# Patient Record
Sex: Female | Born: 1935 | Race: White | Hispanic: No | Smoking: Never smoker
Health system: Southern US, Community
[De-identification: ages and names within clinical notes are randomized; demographics above are authoritative.]

## PROBLEM LIST (undated history)

## (undated) ENCOUNTER — Emergency Department (HOSPITAL_BASED_OUTPATIENT_CLINIC_OR_DEPARTMENT_OTHER): Admission: EM | Payer: Medicare Other

## (undated) DIAGNOSIS — E538 Deficiency of other specified B group vitamins: Secondary | ICD-10-CM

## (undated) DIAGNOSIS — I10 Essential (primary) hypertension: Secondary | ICD-10-CM

## (undated) DIAGNOSIS — E7800 Pure hypercholesterolemia, unspecified: Secondary | ICD-10-CM

## (undated) DIAGNOSIS — H3560 Retinal hemorrhage, unspecified eye: Secondary | ICD-10-CM

## (undated) DIAGNOSIS — L508 Other urticaria: Secondary | ICD-10-CM

## (undated) DIAGNOSIS — M810 Age-related osteoporosis without current pathological fracture: Secondary | ICD-10-CM

## (undated) DIAGNOSIS — N183 Chronic kidney disease, stage 3 unspecified: Secondary | ICD-10-CM

## (undated) DIAGNOSIS — H3530 Unspecified macular degeneration: Secondary | ICD-10-CM

## (undated) DIAGNOSIS — E119 Type 2 diabetes mellitus without complications: Secondary | ICD-10-CM

## (undated) HISTORY — DX: Pure hypercholesterolemia, unspecified: E78.00

## (undated) HISTORY — DX: Other urticaria: L50.8

## (undated) HISTORY — PX: BUNIONECTOMY: SHX129

## (undated) HISTORY — DX: Chronic kidney disease, stage 3 unspecified: N18.30

## (undated) HISTORY — DX: Unspecified macular degeneration: H35.30

## (undated) HISTORY — DX: Retinal hemorrhage, unspecified eye: H35.60

## (undated) HISTORY — DX: Deficiency of other specified B group vitamins: E53.8

## (undated) HISTORY — DX: Age-related osteoporosis without current pathological fracture: M81.0

## (undated) HISTORY — PX: TONSILLECTOMY: SUR1361

## (undated) HISTORY — DX: Essential (primary) hypertension: I10

---

## 1971-07-04 HISTORY — PX: CHOLECYSTECTOMY: SHX55

## 1978-07-03 HISTORY — PX: DILATION AND CURETTAGE, DIAGNOSTIC / THERAPEUTIC: SUR384

## 1998-09-16 ENCOUNTER — Encounter: Payer: Self-pay | Admitting: Family Medicine

## 1998-09-16 ENCOUNTER — Encounter: Admission: RE | Admit: 1998-09-16 | Discharge: 1998-09-16 | Payer: Self-pay | Admitting: *Deleted

## 2000-03-21 ENCOUNTER — Other Ambulatory Visit: Admission: RE | Admit: 2000-03-21 | Discharge: 2000-03-21 | Payer: Self-pay | Admitting: Family Medicine

## 2002-03-12 ENCOUNTER — Other Ambulatory Visit: Admission: RE | Admit: 2002-03-12 | Discharge: 2002-03-12 | Payer: Self-pay | Admitting: Family Medicine

## 2005-03-08 ENCOUNTER — Other Ambulatory Visit: Admission: RE | Admit: 2005-03-08 | Discharge: 2005-03-08 | Payer: Self-pay | Admitting: Family Medicine

## 2008-11-19 ENCOUNTER — Other Ambulatory Visit: Admission: RE | Admit: 2008-11-19 | Discharge: 2008-11-19 | Payer: Self-pay | Admitting: Family Medicine

## 2011-12-19 ENCOUNTER — Ambulatory Visit
Admission: RE | Admit: 2011-12-19 | Discharge: 2011-12-19 | Disposition: A | Discharge status: Home / Self Care | Payer: Medicare Other | Source: Ambulatory Visit | Attending: Family Medicine | Admitting: Family Medicine

## 2011-12-19 ENCOUNTER — Other Ambulatory Visit: Payer: Self-pay | Admitting: Family Medicine

## 2011-12-19 DIAGNOSIS — S60219A Contusion of unspecified wrist, initial encounter: Secondary | ICD-10-CM

## 2011-12-19 IMAGING — CR DG WRIST COMPLETE 3+V*R*
4 series · 4 of 4 positions shown · non-contrast
Comparison: None.

CLINICAL DATA: Fell 2 days ago with pain and swelling

RIGHT WRIST - COMPLETE 3+ VIEW

[x wrist pa right]
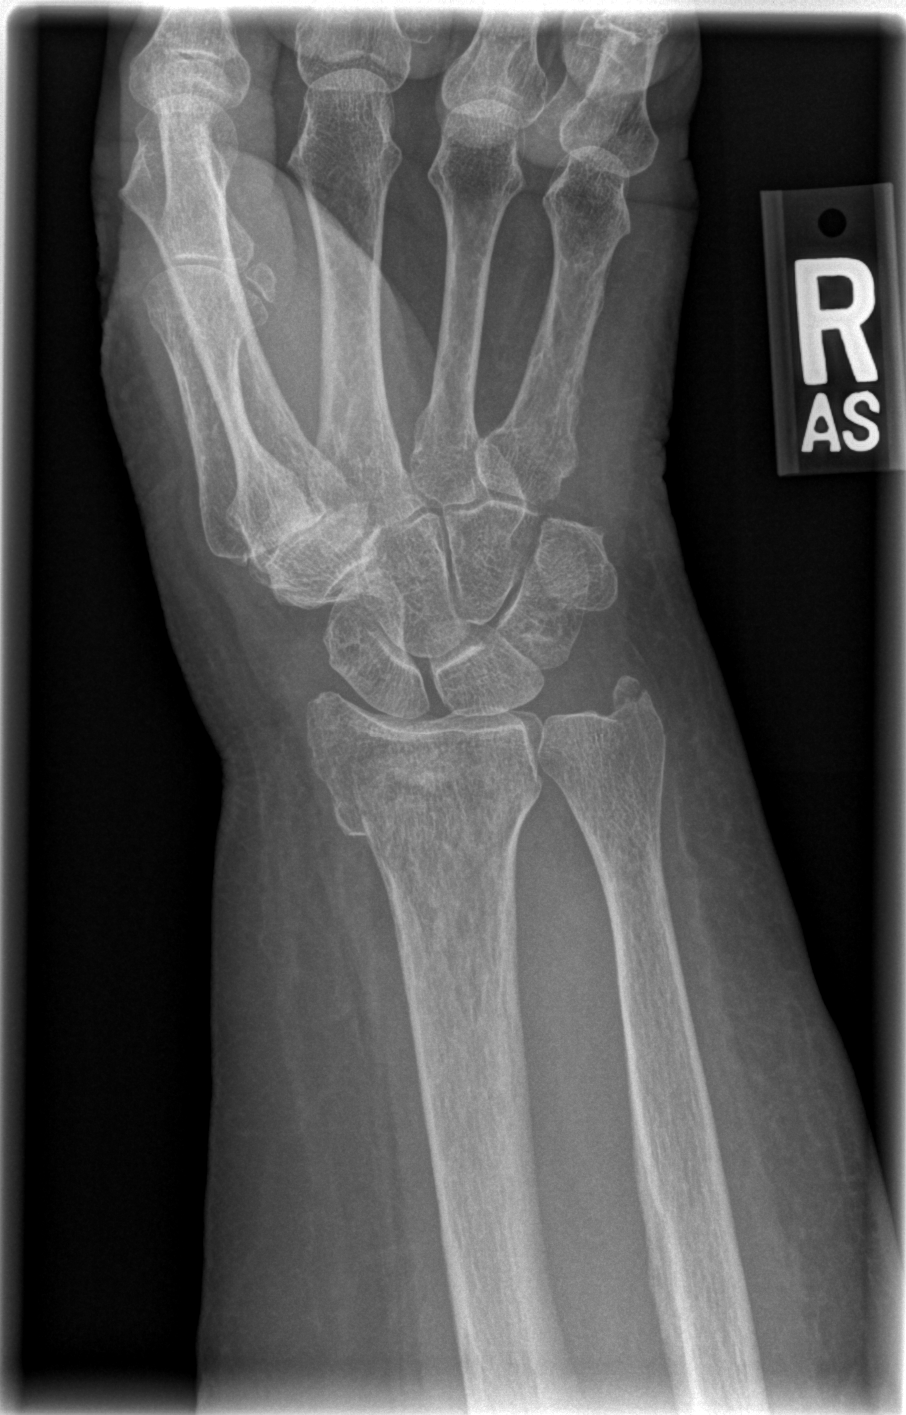

[x wrist obl right]
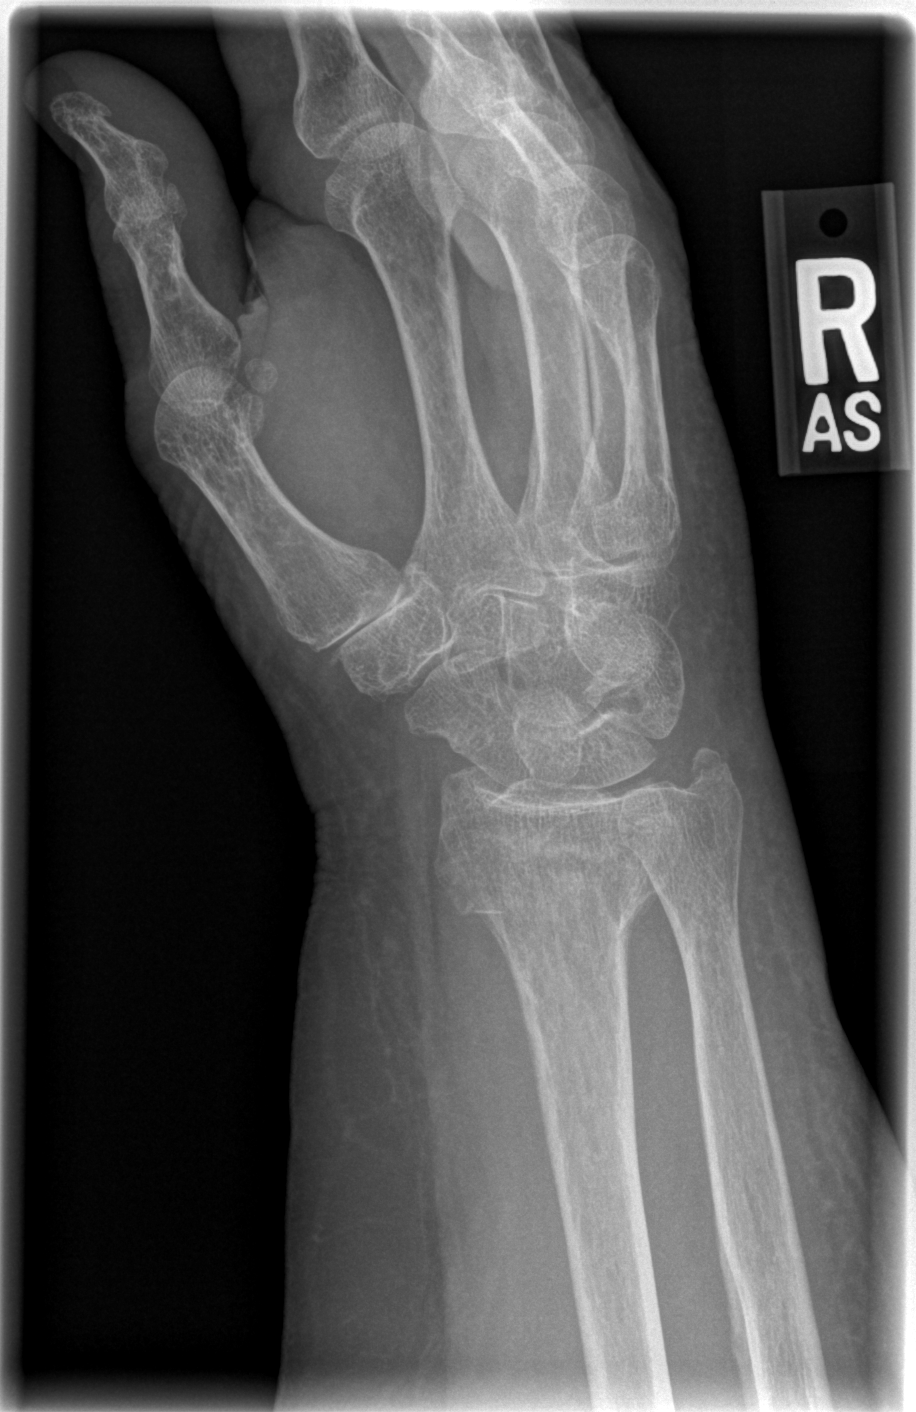

[x wrist lat right]
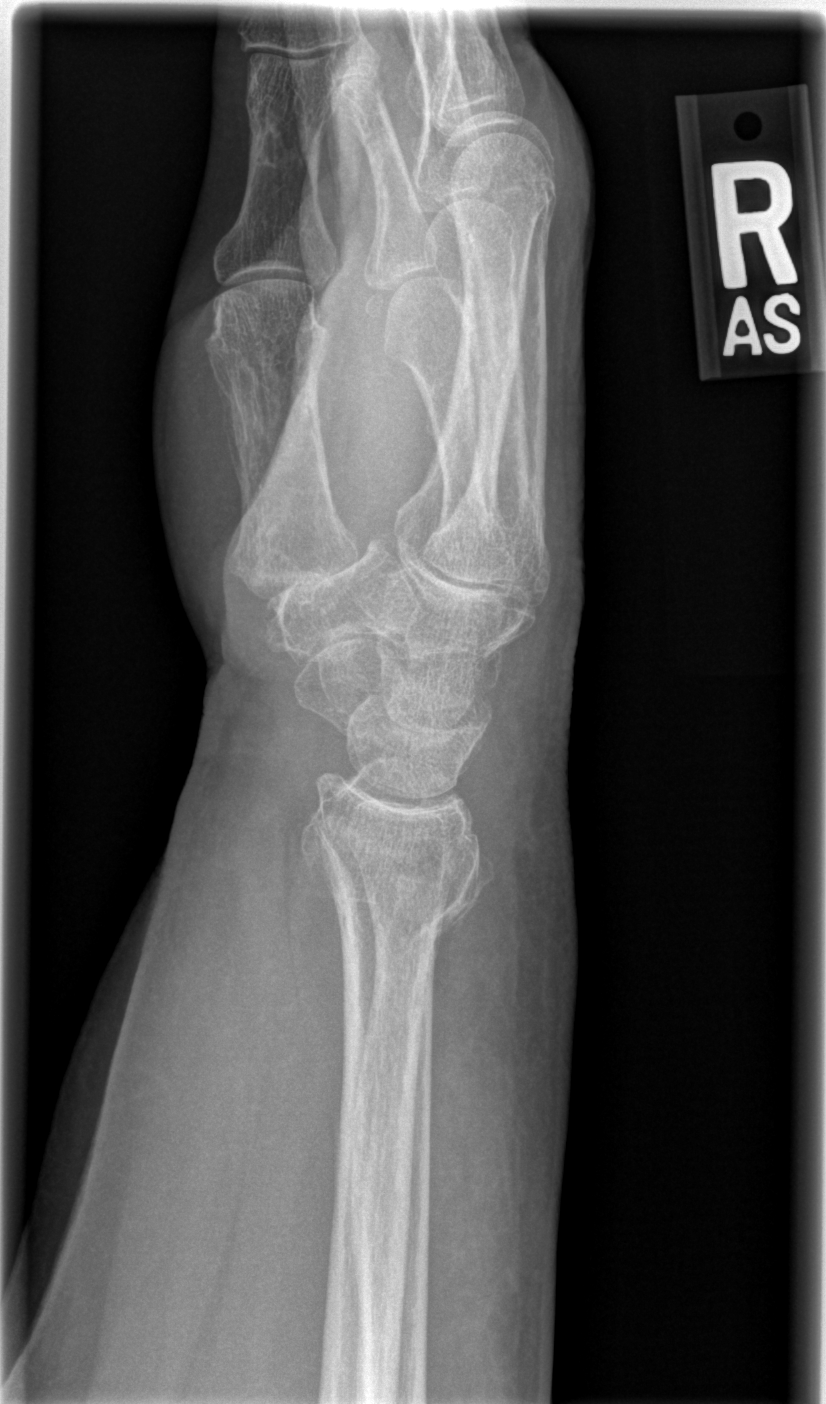

[x navicular]
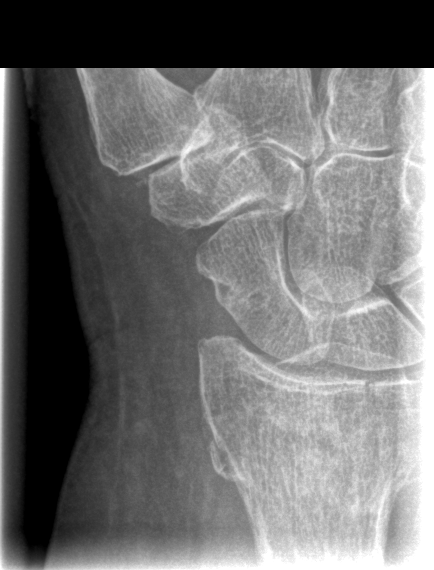

[4 of 4 positions shown; findings below may reference images not displayed]

FINDINGS: There is a slightly impacted comminuted intra-articular
fracture of the distal right radius.  A nondisplaced fracture of
the ulnar styloid is also noted.  The carpal bones are in normal
position.  No other abnormality is seen.
IMPRESSION: Impacted intra-articular fracture of the distal right radius with
fracture of the ulnar styloid.  Osteopenia.

## 2011-12-19 IMAGING — CR DG FOREARM 2V*R*
2 series · 2 of 2 positions shown · non-contrast
Comparison: None.

CLINICAL DATA: Fell 2 days ago with pain and swelling

RIGHT FOREARM - 2 VIEW

[x forearm ap right]
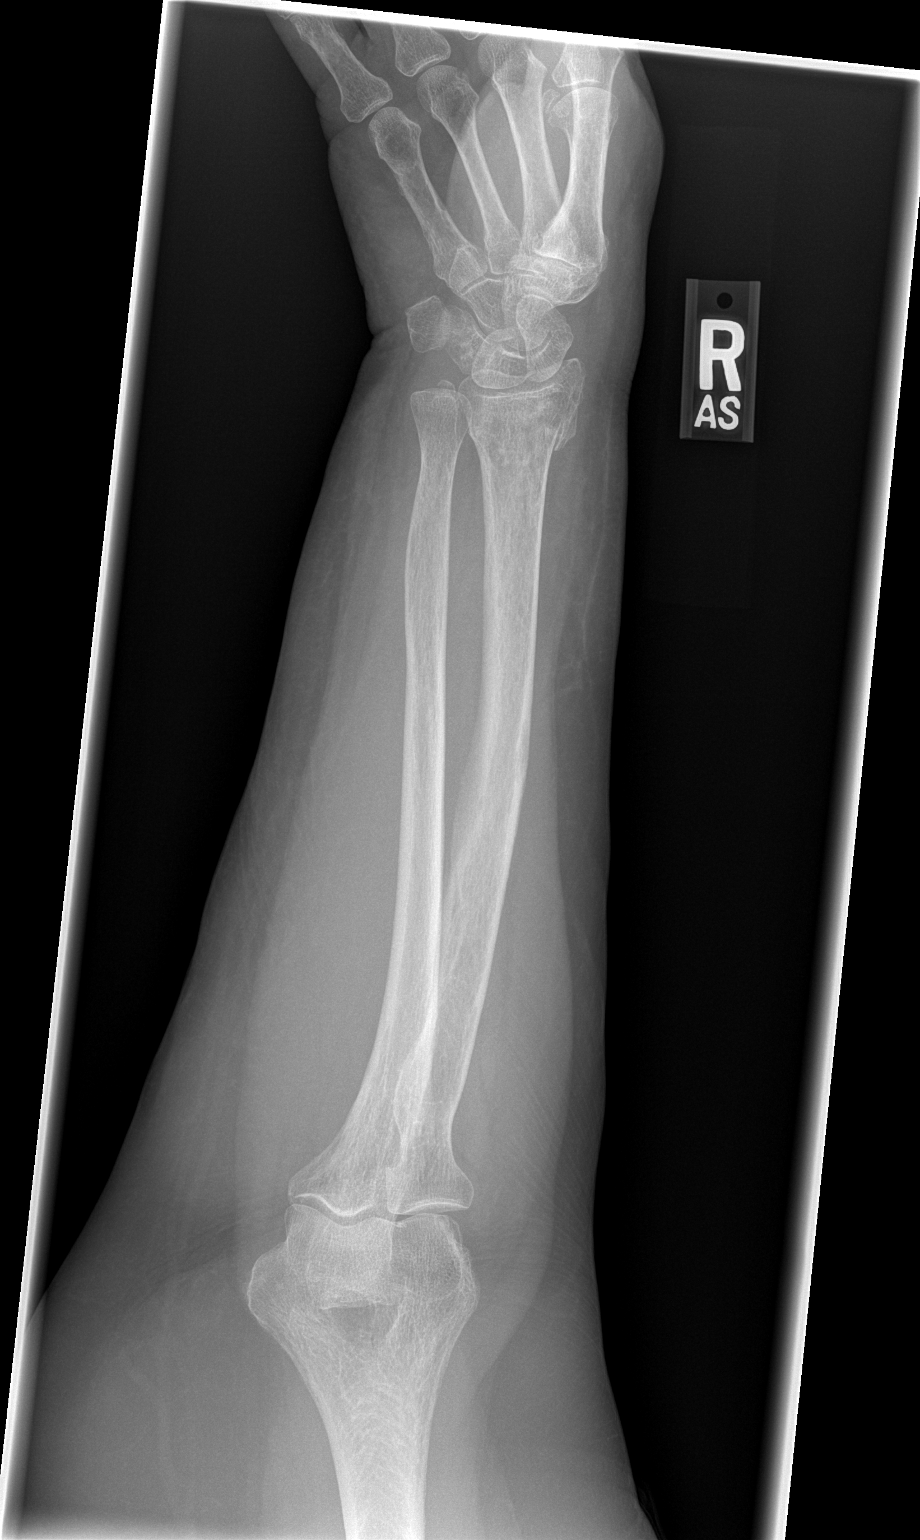

[x forearm lat right]
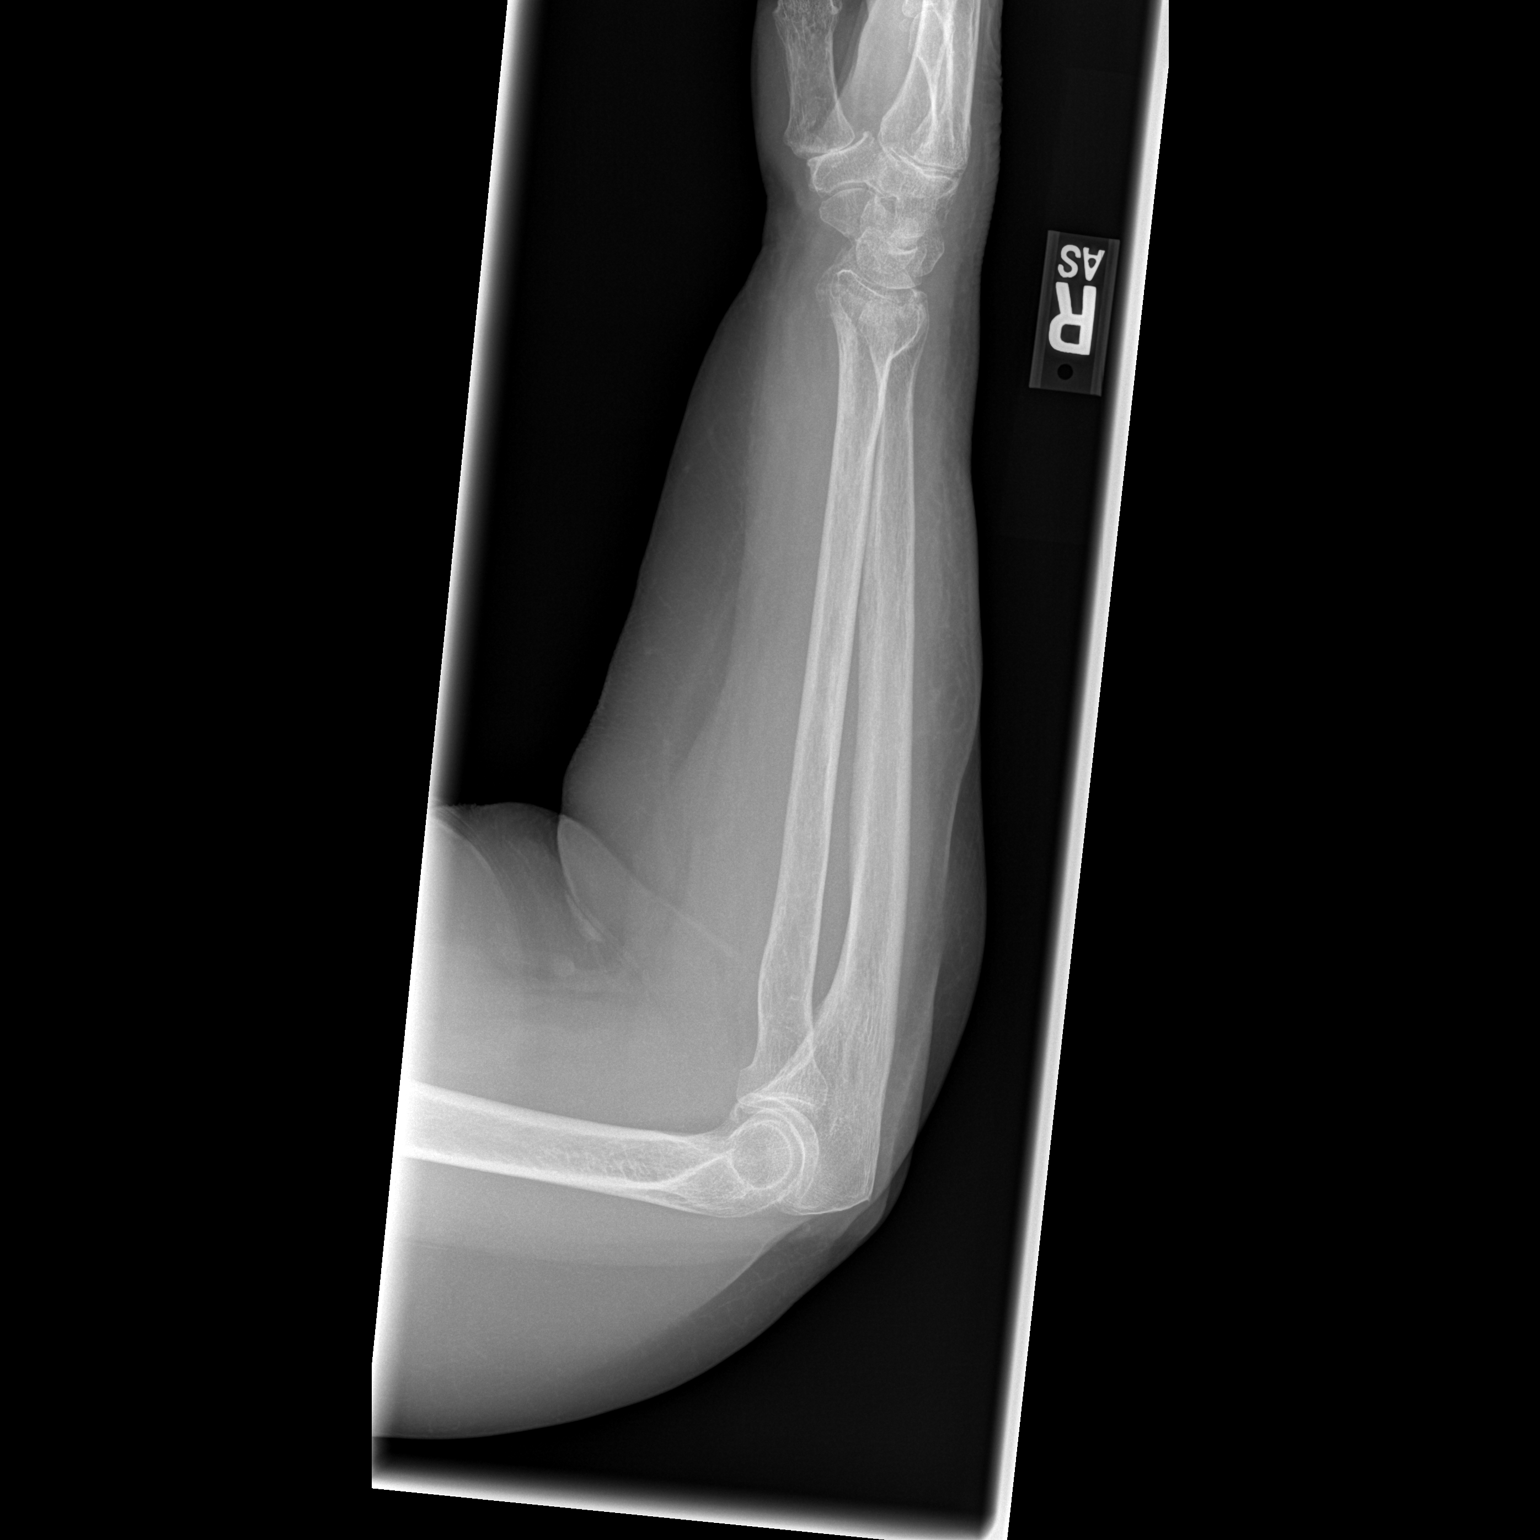

[2 of 2 positions shown; findings below may reference images not displayed]

FINDINGS: An impacted intra-articular fracture of the distal right
radius is noted.  There is also a fracture of the ulnar styloid
which appears nondisplaced.  No other acute abnormality is seen.
No elbow joint effusion is seen.
IMPRESSION: Impacted intra-articular fracture of distal right radius.  Fracture
of the ulnar styloid.

## 2014-07-03 HISTORY — PX: CATARACT EXTRACTION, BILATERAL: SHX1313

## 2016-07-03 DIAGNOSIS — I4891 Unspecified atrial fibrillation: Secondary | ICD-10-CM

## 2016-07-03 HISTORY — DX: Unspecified atrial fibrillation: I48.91

## 2017-02-21 ENCOUNTER — Other Ambulatory Visit: Payer: Self-pay | Admitting: Family Medicine

## 2017-02-21 DIAGNOSIS — R0989 Other specified symptoms and signs involving the circulatory and respiratory systems: Secondary | ICD-10-CM

## 2017-03-14 ENCOUNTER — Ambulatory Visit
Admission: RE | Admit: 2017-03-14 | Discharge: 2017-03-14 | Disposition: A | Discharge status: Home / Self Care | Payer: Medicare Other | Source: Ambulatory Visit | Attending: Family Medicine | Admitting: Family Medicine

## 2017-03-14 DIAGNOSIS — R0989 Other specified symptoms and signs involving the circulatory and respiratory systems: Secondary | ICD-10-CM

## 2017-03-15 ENCOUNTER — Telehealth (HOSPITAL_COMMUNITY): Payer: Self-pay | Admitting: *Deleted

## 2017-03-15 NOTE — Telephone Encounter (Signed)
-----   Message from Shona SimpsonStacy S Katryna Tschirhart, RN sent at 03/15/2017  9:48 AM EDT ----- Regarding: appt  Called & left her a message -- new onset afib from Dr. Merri Brunetteandace Smith (per allred)

## 2017-03-16 NOTE — Telephone Encounter (Signed)
Pt requested appt for 9/20. Taking Eliquis as prescribed. Notes from Dr. Merri Brunette requested.

## 2017-03-22 ENCOUNTER — Ambulatory Visit (HOSPITAL_COMMUNITY)
Admission: RE | Admit: 2017-03-22 | Discharge: 2017-03-22 | Disposition: A | Discharge status: Home / Self Care | Payer: Medicare Other | Source: Ambulatory Visit | Attending: Nurse Practitioner | Admitting: Nurse Practitioner

## 2017-03-22 ENCOUNTER — Encounter (HOSPITAL_COMMUNITY): Payer: Self-pay | Admitting: Nurse Practitioner

## 2017-03-22 VITALS — BP 152/86 | HR 121 | Ht 60.0 in | Wt 184.4 lb

## 2017-03-22 DIAGNOSIS — E119 Type 2 diabetes mellitus without complications: Secondary | ICD-10-CM | POA: Insufficient documentation

## 2017-03-22 DIAGNOSIS — I4891 Unspecified atrial fibrillation: Secondary | ICD-10-CM | POA: Insufficient documentation

## 2017-03-22 DIAGNOSIS — I10 Essential (primary) hypertension: Secondary | ICD-10-CM | POA: Insufficient documentation

## 2017-03-22 DIAGNOSIS — I481 Persistent atrial fibrillation: Secondary | ICD-10-CM

## 2017-03-22 DIAGNOSIS — Z7901 Long term (current) use of anticoagulants: Secondary | ICD-10-CM | POA: Diagnosis not present

## 2017-03-22 DIAGNOSIS — Z7984 Long term (current) use of oral hypoglycemic drugs: Secondary | ICD-10-CM | POA: Diagnosis not present

## 2017-03-22 DIAGNOSIS — Z7982 Long term (current) use of aspirin: Secondary | ICD-10-CM | POA: Insufficient documentation

## 2017-03-22 DIAGNOSIS — I4819 Other persistent atrial fibrillation: Secondary | ICD-10-CM

## 2017-03-22 MED ORDER — BYSTOLIC 5 MG PO TABS: 10.0000 mg | ORAL_TABLET | Freq: Every day | ORAL | Status: DC

## 2017-03-22 NOTE — Progress Notes (Signed)
Primary Care Physician: Laurann Montana, MD Referring Physician: Dr. Merri Brunette   Kayla Zuniga is a 83 y.o. female with a h/o HTN, DM, that was having U/S on her extremities amd it was noted that she had an irregular heart beat from which pt was asymptomatic. She was asked to f/u with her PCP and saw Dr. Basil Dess filling in for Dr. Cliffton Asters, her regular PCP. She was found to be in afib at 114 bpm. She was started on Eliquis 5 mg bid for a chdsvasc score of 5 (agex2, HTN, female, DM).She is still unaware she is in afib with HR today of 121 bpm. She denies a snoring history, no tobacco, no excessive caffeine, one Mint Julip a year on [place] Day.She states that her PCP checked her TSH and it was normal.  Today, she denies symptoms of palpitations, chest pain, shortness of breath, orthopnea, PND, lower extremity edema, dizziness, presyncope, syncope, or neurologic sequela. The patient is tolerating medications without difficulties and is otherwise without complaint today.   No past medical history on file. No past surgical history on file.  Current Outpatient Prescriptions  Medication Sig Dispense Refill  . amLODipine (NORVASC) 5 MG tablet Take 1 tablet by mouth daily.    Marland Kitchen apixaban (ELIQUIS) 5 MG TABS tablet Take 5 mg by mouth 2 (two) times daily.    Marland Kitchen aspirin EC 81 MG tablet Take 81 mg by mouth daily.    Marland Kitchen atorvastatin (LIPITOR) 10 MG tablet 1 tablet daily.    Marland Kitchen BYSTOLIC 5 MG tablet Take 2 tablets (10 mg total) by mouth daily. 30 tablet   . Cholecalciferol (VITAMIN D3) 3000 units TABS Take by mouth.    . metFORMIN (GLUCOPHAGE-XR) 500 MG 24 hr tablet 4 tablets daily.    . Multiple Vitamins-Minerals (ICAPS AREDS 2 PO) Take by mouth.    . Multiple Vitamins-Minerals (MULTIVITAMIN WITH MINERALS) tablet Take 1 tablet by mouth daily.    . Omega-3 Fatty Acids (FISH OIL) 1000 MG CAPS Take by mouth.    . valsartan-hydrochlorothiazide (DIOVAN-HCT) 320-25 MG tablet 1 tablet.     No current  facility-administered medications for this encounter.     Allergies  Allergen Reactions  . Penicillins   . Prolia [Denosumab]     Social History   Social History  . Marital status: Single    Spouse name: N/A  . Number of children: N/A  . Years of education: N/A   Occupational History  . Not on file.   Social History Main Topics  . Smoking status: Never Smoker  . Smokeless tobacco: Never Used  . Alcohol use Yes     Comment: rarely  . Drug use: Unknown  . Sexual activity: Not on file   Other Topics Concern  . Not on file   Social History Narrative  . No narrative on file    No family history on file.  ROS- All systems are reviewed and negative except as per the HPI above  Physical Exam: Vitals:   03/22/17 0932  BP: (!) 152/86  Pulse: (!) 121  Weight: 184 lb 6.4 oz (83.6 kg)  Height: 5' (1.524 m)   Wt Readings from Last 3 Encounters:  03/22/17 184 lb 6.4 oz (83.6 kg)    Labs: No results found for: NA, K, CL, CO2, GLUCOSE, BUN, CREATININE, CALCIUM, PHOS, MG No results found for: INR No results found for: CHOL, HDL, LDLCALC, TRIG   GEN- The patient is well appearing, alert and oriented  x 3 today.   Head- normocephalic, atraumatic Eyes-  Sclera clear, conjunctiva pink Ears- hearing intact Oropharynx- clear Neck- supple, no JVP Lymph- no cervical lymphadenopathy Lungs- Clear to ausculation bilaterally, normal work of breathing Heart- irregular rate and rhythm, no murmurs, rubs or gallops, PMI not laterally displaced GI- soft, NT, ND, + BS Extremities- no clubbing, cyanosis, or edema MS- no significant deformity or atrophy Skin- no rash or lesion Psych- euthymic mood, full affect Neuro- strength and sensation are intact  EKG- Reviewed from PCP office, afib  with v rate at 114 States that creatinine is 1.00 Ekg today with v rate of 121 bpm, qrs int 76 ms, qtc 460 ms Epic records reviewed   Assessment and Plan: 1. Afib/asymtomatic ?  Onset General education re afib Will increase Bystolic for rate control  Continue Eliquis 5 mg bid for chadsvasc score of 5, appropriately dosed, no bleeding issues Encouraged regular exercise and weight loss Echo  2. HTN Mildly elevated today Increase in Bystolic will help  3. DM Under good control per pt  F/u in 2 weeks  Lupita Leash C. [facility] [address] [place], [place] [address] [phone]

## 2017-03-22 NOTE — Patient Instructions (Signed)
Your physician has recommended you make the following change in your medication:  1)increase bystolic to  once a day (2 of your  tablets)

## 2017-03-28 ENCOUNTER — Other Ambulatory Visit (HOSPITAL_COMMUNITY): Payer: Medicare Other

## 2017-04-02 ENCOUNTER — Ambulatory Visit (HOSPITAL_COMMUNITY)
Admission: RE | Admit: 2017-04-02 | Discharge: 2017-04-02 | Disposition: A | Discharge status: Home / Self Care | Payer: Medicare Other | Source: Ambulatory Visit | Attending: Nurse Practitioner | Admitting: Nurse Practitioner

## 2017-04-02 DIAGNOSIS — I481 Persistent atrial fibrillation: Secondary | ICD-10-CM | POA: Insufficient documentation

## 2017-04-02 DIAGNOSIS — I10 Essential (primary) hypertension: Secondary | ICD-10-CM | POA: Diagnosis not present

## 2017-04-02 DIAGNOSIS — I4819 Other persistent atrial fibrillation: Secondary | ICD-10-CM

## 2017-04-02 DIAGNOSIS — I340 Nonrheumatic mitral (valve) insufficiency: Secondary | ICD-10-CM | POA: Diagnosis not present

## 2017-04-02 DIAGNOSIS — E119 Type 2 diabetes mellitus without complications: Secondary | ICD-10-CM | POA: Insufficient documentation

## 2017-04-02 DIAGNOSIS — I4891 Unspecified atrial fibrillation: Secondary | ICD-10-CM | POA: Diagnosis present

## 2017-04-02 NOTE — Progress Notes (Signed)
  Echocardiogram 2D Echocardiogram has been performed.  Kayla Zuniga 04/02/2017, 9:42 AM 

## 2017-04-02 NOTE — Progress Notes (Signed)
  Echocardiogram 2D Echocardiogram has been performed.  Leta Jungling M 04/02/2017, 9:42 AM

## 2017-04-03 ENCOUNTER — Ambulatory Visit (HOSPITAL_COMMUNITY)
Admission: RE | Admit: 2017-04-03 | Discharge: 2017-04-03 | Disposition: A | Discharge status: Home / Self Care | Payer: Medicare Other | Source: Ambulatory Visit | Attending: Nurse Practitioner | Admitting: Nurse Practitioner

## 2017-04-03 ENCOUNTER — Encounter (HOSPITAL_COMMUNITY): Payer: Self-pay | Admitting: Nurse Practitioner

## 2017-04-03 VITALS — BP 142/86 | HR 121 | Ht 60.0 in | Wt 188.8 lb

## 2017-04-03 DIAGNOSIS — Z888 Allergy status to other drugs, medicaments and biological substances status: Secondary | ICD-10-CM | POA: Insufficient documentation

## 2017-04-03 DIAGNOSIS — Z7982 Long term (current) use of aspirin: Secondary | ICD-10-CM | POA: Insufficient documentation

## 2017-04-03 DIAGNOSIS — Z7984 Long term (current) use of oral hypoglycemic drugs: Secondary | ICD-10-CM | POA: Diagnosis not present

## 2017-04-03 DIAGNOSIS — I4819 Other persistent atrial fibrillation: Secondary | ICD-10-CM

## 2017-04-03 DIAGNOSIS — R635 Abnormal weight gain: Secondary | ICD-10-CM | POA: Diagnosis not present

## 2017-04-03 DIAGNOSIS — Z6836 Body mass index (BMI) 36.0-36.9, adult: Secondary | ICD-10-CM | POA: Diagnosis not present

## 2017-04-03 DIAGNOSIS — Z880 Allergy status to penicillin: Secondary | ICD-10-CM | POA: Insufficient documentation

## 2017-04-03 DIAGNOSIS — I481 Persistent atrial fibrillation: Secondary | ICD-10-CM | POA: Diagnosis not present

## 2017-04-03 DIAGNOSIS — R600 Localized edema: Secondary | ICD-10-CM | POA: Diagnosis not present

## 2017-04-03 DIAGNOSIS — E119 Type 2 diabetes mellitus without complications: Secondary | ICD-10-CM | POA: Diagnosis not present

## 2017-04-03 DIAGNOSIS — Z7901 Long term (current) use of anticoagulants: Secondary | ICD-10-CM | POA: Diagnosis not present

## 2017-04-03 DIAGNOSIS — I4891 Unspecified atrial fibrillation: Secondary | ICD-10-CM | POA: Insufficient documentation

## 2017-04-03 DIAGNOSIS — I10 Essential (primary) hypertension: Secondary | ICD-10-CM | POA: Diagnosis present

## 2017-04-03 DIAGNOSIS — Z79899 Other long term (current) drug therapy: Secondary | ICD-10-CM | POA: Insufficient documentation

## 2017-04-03 LAB — BASIC METABOLIC PANEL
Anion gap: 8 (ref 5–15)
BUN: 10 mg/dL (ref 6–20)
CHLORIDE: 100 mmol/L — AB (ref 101–111)
CO2: 28 mmol/L (ref 22–32)
CREATININE: 0.86 mg/dL (ref 0.44–1.00)
Calcium: 9 mg/dL (ref 8.9–10.3)
GFR calc non Af Amer: >60 mL/min (ref >60)
Glucose, Bld: 134 mg/dL — ABNORMAL HIGH (ref 65–99)
Potassium: 4 mmol/L (ref 3.5–5.1)
Sodium: 136 mmol/L (ref 135–145)

## 2017-04-03 LAB — MAGNESIUM: Magnesium: 1.6 mg/dL — ABNORMAL LOW (ref 1.7–2.4)

## 2017-04-03 MED ORDER — FUROSEMIDE 20 MG PO TABS: 20.0000 mg | ORAL_TABLET | Freq: Every day | ORAL | 2 refills | Status: DC

## 2017-04-03 MED ORDER — VALSARTAN 320 MG PO TABS: 320.0000 mg | ORAL_TABLET | Freq: Every day | ORAL | 2 refills | Status: DC

## 2017-04-03 NOTE — Progress Notes (Signed)
Primary Care Physician: Laurann Montana, MD Referring Physician: Dr. Merri Brunette   Kayla Zuniga is a 82 y.o. female with a h/o HTN, DM, that was having U/S on her extremities amd it was noted that she had an irregular heart beat from which pt was asymptomatic. She was asked to f/u with her PCP and saw Dr. Basil Dess filling in for Dr. Cliffton Asters, her regular PCP. She was found to be in afib at 114 bpm. She was started on Eliquis 5 mg bid for a chadsvasc score of 5 (agex2, HTN, female, DM).She is still unaware she is in afib with HR today of 121 bpm. She denies a snoring history, no tobacco, no excessive caffeine, one Mint Julip a year on [place].She states that her PCP checked her TSH and it was normal.  F/u in afib clinic, 10/2, to evaluate increase in bystolic for rate control. She still is with rvr at 121 beats per min. She has noted increase in ankle edema and weight is up 4 lbs. States that HR at home is running slightly over 100. She does not note any fatigue or shortness of breath.Echo as of yesterday showed normal heart function and structure.  Today, she denies symptoms of palpitations, chest pain, shortness of breath, orthopnea, PND  dizziness, presyncope, syncope, or neurologic sequela. Positive for weight gain and ankle edema. The patient is tolerating medications without difficulties and is otherwise without complaint today.   No past medical history on file. No past surgical history on file.  Current Outpatient Prescriptions  Medication Sig Dispense Refill  . amLODipine (NORVASC) 5 MG tablet Take 1 tablet by mouth daily.    Marland Kitchen apixaban (ELIQUIS) 5 MG TABS tablet Take 5 mg by mouth 2 (two) times daily.    Marland Kitchen aspirin EC 81 MG tablet Take 81 mg by mouth daily.    Marland Kitchen atorvastatin (LIPITOR) 10 MG tablet 1 tablet daily.    Marland Kitchen BYSTOLIC 5 MG tablet Take 2 tablets (10 mg total) by mouth daily. 30 tablet   . Cholecalciferol (VITAMIN D3) 3000 units TABS Take by mouth.    . metFORMIN  (GLUCOPHAGE-XR) 500 MG 24 hr tablet 4 tablets daily.    . Multiple Vitamins-Minerals (ICAPS AREDS 2 PO) Take by mouth.    . Multiple Vitamins-Minerals (MULTIVITAMIN WITH MINERALS) tablet Take 1 tablet by mouth daily.    . Omega-3 Fatty Acids (FISH OIL) 1000 MG CAPS Take by mouth.    . valsartan-hydrochlorothiazide (DIOVAN-HCT) 320-25 MG tablet 1 tablet.     No current facility-administered medications for this encounter.     Allergies  Allergen Reactions  . Penicillins   . Prolia [Denosumab]     Social History   Social History  . Marital status: Single    Spouse name: N/A  . Number of children: N/A  . Years of education: N/A   Occupational History  . Not on file.   Social History Main Topics  . Smoking status: Never Smoker  . Smokeless tobacco: Never Used  . Alcohol use Yes     Comment: rarely  . Drug use: Unknown  . Sexual activity: Not on file   Other Topics Concern  . Not on file   Social History Narrative  . No narrative on file    No family history on file.  ROS- All systems are reviewed and negative except as per the HPI above  Physical Exam: There were no vitals filed for this visit. Wt Readings from Last 3  Encounters:  03/22/17 184 lb 6.4 oz (83.6 kg)    Labs: No results found for: NA, K, CL, CO2, GLUCOSE, BUN, CREATININE, CALCIUM, PHOS, MG No results found for: INR No results found for: CHOL, HDL, LDLCALC, TRIG   GEN- The patient is well appearing, alert and oriented x 3 today.   Head- normocephalic, atraumatic Eyes-  Sclera clear, conjunctiva pink Ears- hearing intact Oropharynx- clear Neck- supple, no JVP Lymph- no cervical lymphadenopathy Lungs- Clear to ausculation bilaterally, normal work of breathing Heart- irregular rate and rhythm, no murmurs, rubs or gallops, PMI not laterally displaced GI- soft, NT, ND, + BS Extremities- no clubbing, cyanosis, or edema MS- no significant deformity or atrophy Skin- no rash or lesion Psych-  euthymic mood, full affect Neuro- strength and sensation are intact  EKG- Afib with rvr at 121 bpm,  qrs int 74 ms, qtc 431 ms States that creatinine is 1.00 Ekg today with v rate of 121 bpm, qrs int 76 ms, qtc 460 ms Epic records reviewed Echo-Study Conclusions  - Left ventricle: The cavity size was normal. Wall thickness was   normal. Systolic function was normal. The estimated ejection   fraction was in the range of 60% to 65%. - Mitral valve: There was mild regurgitation. - Pulmonary arteries: PA peak pressure: 37 mm Hg (S).  Assessment and Plan: 1. Afib/asymtomatic ? Onset General education re afib Increase in  Bystolic  did not show any effect on rate control but continue at 10 mg daily Continue Eliquis 5 mg bid for chadsvasc score of 5, appropriately dosed, no bleeding issues Encouraged regular exercise and weight loss Echo  2. HTN Mildly elevated today   3. DM Under good control per pt  4. LEE, weight gain Probably 2/2 RVR Add lasix 20 mg daily Stop DIovan/HCT, will call in Diovan 320 mg daily Bmet/mag today Will try to manage fluid first, on next visit if still with RVR, will try to stop amlodipine and add cardizem daily  F/u on Friday  Radha Coggins C. [facility] [address] [place], [place] [address] [phone]

## 2017-04-03 NOTE — Patient Instructions (Signed)
Your physician has recommended you make the following change in your medication:  1)Stop valsartan/hctz (diovan) 2)Start Valsartan  once a day 3)Start Lasix (furosemide)  once a day

## 2017-04-06 ENCOUNTER — Ambulatory Visit (HOSPITAL_COMMUNITY)
Admission: RE | Admit: 2017-04-06 | Discharge: 2017-04-06 | Disposition: A | Discharge status: Home / Self Care | Payer: Medicare Other | Source: Ambulatory Visit | Attending: Nurse Practitioner | Admitting: Nurse Practitioner

## 2017-04-06 ENCOUNTER — Encounter (HOSPITAL_COMMUNITY): Payer: Self-pay | Admitting: Nurse Practitioner

## 2017-04-06 VITALS — BP 142/82 | HR 99 | Ht 60.0 in | Wt 185.6 lb

## 2017-04-06 DIAGNOSIS — Z7982 Long term (current) use of aspirin: Secondary | ICD-10-CM | POA: Insufficient documentation

## 2017-04-06 DIAGNOSIS — Z880 Allergy status to penicillin: Secondary | ICD-10-CM | POA: Diagnosis not present

## 2017-04-06 DIAGNOSIS — I4891 Unspecified atrial fibrillation: Secondary | ICD-10-CM | POA: Diagnosis present

## 2017-04-06 DIAGNOSIS — I10 Essential (primary) hypertension: Secondary | ICD-10-CM | POA: Insufficient documentation

## 2017-04-06 DIAGNOSIS — I4819 Other persistent atrial fibrillation: Secondary | ICD-10-CM

## 2017-04-06 DIAGNOSIS — E119 Type 2 diabetes mellitus without complications: Secondary | ICD-10-CM | POA: Insufficient documentation

## 2017-04-06 DIAGNOSIS — Z7984 Long term (current) use of oral hypoglycemic drugs: Secondary | ICD-10-CM | POA: Insufficient documentation

## 2017-04-06 DIAGNOSIS — Z7901 Long term (current) use of anticoagulants: Secondary | ICD-10-CM | POA: Insufficient documentation

## 2017-04-06 DIAGNOSIS — I481 Persistent atrial fibrillation: Secondary | ICD-10-CM

## 2017-04-06 DIAGNOSIS — Z888 Allergy status to other drugs, medicaments and biological substances status: Secondary | ICD-10-CM | POA: Diagnosis not present

## 2017-04-06 DIAGNOSIS — Z79899 Other long term (current) drug therapy: Secondary | ICD-10-CM | POA: Diagnosis not present

## 2017-04-06 LAB — CBC
HEMATOCRIT: 37.7 % (ref 36.0–46.0)
HEMOGLOBIN: 13.1 g/dL (ref 12.0–15.0)
MCH: 34 pg (ref 26.0–34.0)
MCHC: 34.7 g/dL (ref 30.0–36.0)
MCV: 97.9 fL (ref 78.0–100.0)
Platelets: 265 10*3/uL (ref 150–400)
RBC: 3.85 MIL/uL — AB (ref 3.87–5.11)
RDW: 12.4 % (ref 11.5–15.5)
WBC: 6.8 10*3/uL (ref 4.0–10.5)

## 2017-04-06 LAB — BASIC METABOLIC PANEL
ANION GAP: 8 (ref 5–15)
BUN: 17 mg/dL (ref 6–20)
CALCIUM: 9.4 mg/dL (ref 8.9–10.3)
CHLORIDE: 100 mmol/L — AB (ref 101–111)
CO2: 30 mmol/L (ref 22–32)
Creatinine, Ser: 0.93 mg/dL (ref 0.44–1.00)
GFR calc non Af Amer: 56 mL/min — ABNORMAL LOW (ref >60)
GLUCOSE: 121 mg/dL — AB (ref 65–99)
POTASSIUM: 3.9 mmol/L (ref 3.5–5.1)
Sodium: 138 mmol/L (ref 135–145)

## 2017-04-06 LAB — TSH: TSH: 5.006 u[IU]/mL — ABNORMAL HIGH (ref 0.350–4.500)

## 2017-04-06 MED ORDER — APIXABAN 5 MG PO TABS: 5.0000 mg | ORAL_TABLET | Freq: Two times a day (BID) | ORAL | 2 refills | Status: DC

## 2017-04-06 NOTE — Progress Notes (Signed)
Primary Care Physician: White, Cynthia, MD Referring Physician: Dr. Merri BLaurann MontanaRETA Zuniga is a 83 y.o. female with a h/o HTN, DM, that was having U/S on her extremities amd it was noted that she had an irregular heart beat from which pt was asymptomatic. She was asked to f/u with her PCP and saw Dr. Basil Dess filling in for Dr. Cliffton Asters, her regular PCP. She was found to be in afib at 114 bpm. She was started on Eliquis 5 mg bid, 9/13, for a chadsvasc, score of 5 (agex2, HTN, female, DM) She denies a snoring history, no tobacco, no excessive caffeine, one Mint Julip a year on [place].  F/u in afib clinic, 10/2, to evaluate increase in bystolic for rate control. She still is with rvr at 121 beats per min. She had noted increase in ankle edema and weight is up 4 lbs. States that HR at home is running slightly over 100. She does not note any fatigue or shortness of breath.Echo as of yesterday showed normal heart function and structure.  F/u in afib clinic 10/5. Her weight is down 3 lbs since starting lasix. Ankle edema improved. HR is 99 and has been under 100 bpm at  home. She has now been on anticoagulation  X 3 weeks without missed doses since 9/13. Cardioversion discussed with pt and she would like to pursue this.   Today, she denies symptoms of palpitations, chest pain, shortness of breath, orthopnea, PND  dizziness, presyncope, syncope, or neurologic sequela. Positive for weight gain and ankle edema, improved with lasix.. The patient is tolerating medications without difficulties and is otherwise without complaint today.   No past medical history on file. No past surgical history on file.  Current Outpatient Prescriptions  Medication Sig Dispense Refill  . amLODipine (NORVASC) 5 MG tablet Take 1 tablet by mouth daily.    Marland Kitchen apixaban (ELIQUIS) 5 MG TABS tablet Take 1 tablet (5 mg total) by mouth 2 (two) times daily. 180 tablet 2  . aspirin EC 81 MG tablet Take 81 mg by mouth  daily.    Marland Kitchen atorvastatin (LIPITOR) 10 MG tablet 1 tablet daily.    Marland Kitchen BYSTOLIC 5 MG tablet Take 2 tablets (10 mg total) by mouth daily. 30 tablet   . Cholecalciferol (VITAMIN D3) 3000 units TABS Take by mouth.    . furosemide (LASIX) 20 MG tablet Take 1 tablet (20 mg total) by mouth daily. 30 tablet 2  . metFORMIN (GLUCOPHAGE-XR) 500 MG 24 hr tablet 4 tablets daily.    . Multiple Vitamins-Minerals (ICAPS AREDS 2 PO) Take by mouth.    . Multiple Vitamins-Minerals (MULTIVITAMIN WITH MINERALS) tablet Take 1 tablet by mouth daily.    . Omega-3 Fatty Acids (FISH OIL) 1000 MG CAPS Take by mouth.    . valsartan (DIOVAN) 320 MG tablet Take 1 tablet (320 mg total) by mouth daily. 30 tablet 2   No current facility-administered medications for this encounter.     Allergies  Allergen Reactions  . Penicillins   . Prolia [Denosumab]     Social History   Social History  . Marital status: Single    Spouse name: N/A  . Number of children: N/A  . Years of education: N/A   Occupational History  . Not on file.   Social History Main Topics  . Smoking status: Never Smoker  . Smokeless tobacco: Never Used  . Alcohol use Yes     Comment: rarely  . Drug  use: Unknown  . Sexual activity: Not on file   Other Topics Concern  . Not on file   Social History Narrative  . No narrative on file    No family history on file.  ROS- All systems are reviewed and negative except as per the HPI above  Physical Exam: Vitals:   04/06/17 1217  BP: (!) 142/82  Pulse: 99  Weight: 185 lb 9.6 oz (84.2 kg)  Height: 5' (1.524 m)   Wt Readings from Last 3 Encounters:  04/06/17 185 lb 9.6 oz (84.2 kg)  04/03/17 188 lb 12.8 oz (85.6 kg)  03/22/17 184 lb 6.4 oz (83.6 kg)    Labs: Lab Results  Component Value Date   NA 138 04/06/2017   K 3.9 04/06/2017   CL 100 (L) 04/06/2017   CO2 30 04/06/2017   GLUCOSE 121 (H) 04/06/2017   BUN 17 04/06/2017   CREATININE 0.93 04/06/2017   CALCIUM 9.4 04/06/2017     MG 1.6 (L) 04/03/2017   No results found for: INR No results found for: CHOL, HDL, LDLCALC, TRIG   GEN- The patient is well appearing, alert and oriented x 3 today.   Head- normocephalic, atraumatic Eyes-  Sclera clear, conjunctiva pink Ears- hearing intact Oropharynx- clear Neck- supple, no JVP Lymph- no cervical lymphadenopathy Lungs- Clear to ausculation bilaterally, normal work of breathing Heart- irregular rate and rhythm, no murmurs, rubs or gallops, PMI not laterally displaced GI- soft, NT, ND, + BS Extremities- no clubbing, cyanosis, or edema MS- no significant deformity or atrophy Skin- no rash or lesion Psych- euthymic mood, full affect Neuro- strength and sensation are intact  EKG- Afib  at 99 bpm,  qrs int 74 ms, qtc 433 ms Epic records reviewed Echo-Study Conclusions  - Left ventricle: The cavity size was normal. Wall thickness was   normal. Systolic function was normal. The estimated ejection   fraction was in the range of 60% to 65%. - Mitral valve: There was mild regurgitation. - Pulmonary arteries: PA peak pressure: 37 mm Hg (S).  Assessment and Plan: 1. Afib/asymtomatic ? Onset, found 9/12 General education re afib and cardioverion Continue  Bystolic at 10 mg daily   Continue Eliquis 5 mg bid for chadsvasc score of 5, appropriately dosed, no bleeding issues, has been on consistently since 9/13 Will plan for scheduled cardioversion 10/12 Bmet, cbc, tsh today  2. LLE Improved with lasix, likely 2/2 afib Continue 20 mg a day  2. HTN stable  3. DM Under good control per pt   F/u one week after cardioversion  Lupita Leash C. [facility] [address] [place], [place] [address] [phone]

## 2017-04-06 NOTE — Patient Instructions (Signed)
Cardioversion scheduled for Friday, October 12th  - Arrive at the [facility] and go to admitting at 9:30AM  -Do not eat or drink anything after midnight the night prior to your procedure.  - Take all your medication with a sip of water prior to arrival.  - You will not be able to drive home after your procedure.  

## 2017-04-13 ENCOUNTER — Ambulatory Visit (HOSPITAL_COMMUNITY)
Admission: RE | Admit: 2017-04-13 | Discharge: 2017-04-13 | Disposition: A | Discharge status: Home / Self Care | Payer: Medicare Other | Source: Ambulatory Visit | Attending: Internal Medicine | Admitting: Internal Medicine

## 2017-04-13 ENCOUNTER — Encounter (HOSPITAL_COMMUNITY)
Admission: RE | Disposition: A | Discharge status: Home / Self Care | Payer: Self-pay | Source: Ambulatory Visit | Attending: Internal Medicine

## 2017-04-13 ENCOUNTER — Ambulatory Visit (HOSPITAL_COMMUNITY): Payer: Medicare Other | Admitting: Anesthesiology

## 2017-04-13 ENCOUNTER — Encounter (HOSPITAL_COMMUNITY): Payer: Self-pay | Admitting: *Deleted

## 2017-04-13 DIAGNOSIS — I4819 Other persistent atrial fibrillation: Secondary | ICD-10-CM

## 2017-04-13 DIAGNOSIS — Z79899 Other long term (current) drug therapy: Secondary | ICD-10-CM | POA: Insufficient documentation

## 2017-04-13 DIAGNOSIS — Z7984 Long term (current) use of oral hypoglycemic drugs: Secondary | ICD-10-CM | POA: Diagnosis not present

## 2017-04-13 DIAGNOSIS — E669 Obesity, unspecified: Secondary | ICD-10-CM | POA: Insufficient documentation

## 2017-04-13 DIAGNOSIS — Z6836 Body mass index (BMI) 36.0-36.9, adult: Secondary | ICD-10-CM | POA: Insufficient documentation

## 2017-04-13 DIAGNOSIS — Z888 Allergy status to other drugs, medicaments and biological substances status: Secondary | ICD-10-CM | POA: Insufficient documentation

## 2017-04-13 DIAGNOSIS — Z7982 Long term (current) use of aspirin: Secondary | ICD-10-CM | POA: Diagnosis not present

## 2017-04-13 DIAGNOSIS — Z880 Allergy status to penicillin: Secondary | ICD-10-CM | POA: Diagnosis not present

## 2017-04-13 DIAGNOSIS — I10 Essential (primary) hypertension: Secondary | ICD-10-CM | POA: Diagnosis not present

## 2017-04-13 DIAGNOSIS — E119 Type 2 diabetes mellitus without complications: Secondary | ICD-10-CM | POA: Diagnosis not present

## 2017-04-13 DIAGNOSIS — I4891 Unspecified atrial fibrillation: Secondary | ICD-10-CM | POA: Diagnosis present

## 2017-04-13 DIAGNOSIS — Z7901 Long term (current) use of anticoagulants: Secondary | ICD-10-CM | POA: Insufficient documentation

## 2017-04-13 DIAGNOSIS — I481 Persistent atrial fibrillation: Secondary | ICD-10-CM

## 2017-04-13 DIAGNOSIS — R600 Localized edema: Secondary | ICD-10-CM | POA: Diagnosis not present

## 2017-04-13 HISTORY — DX: Type 2 diabetes mellitus without complications: E11.9

## 2017-04-13 HISTORY — DX: Essential (primary) hypertension: I10

## 2017-04-13 HISTORY — PX: CARDIOVERSION: SHX1299

## 2017-04-13 LAB — GLUCOSE, CAPILLARY: GLUCOSE-CAPILLARY: 132 mg/dL — AB (ref 65–99)

## 2017-04-13 SURGERY — CARDIOVERSION
Anesthesia: General

## 2017-04-13 MED ORDER — LIDOCAINE 2% (20 MG/ML) 5 ML SYRINGE
INTRAMUSCULAR | Status: DC | PRN
  Administered 2017-04-13: 40 mg via INTRAVENOUS

## 2017-04-13 MED ORDER — SODIUM CHLORIDE 0.9 % IV SOLN
INTRAVENOUS | Status: AC | PRN
  Administered 2017-04-13: 1000 mL via INTRAVENOUS

## 2017-04-13 MED ORDER — PROPOFOL 10 MG/ML IV BOLUS
INTRAVENOUS | Status: DC | PRN
  Administered 2017-04-13: 50 mg via INTRAVENOUS

## 2017-04-13 NOTE — H&P (Signed)
   INTERVAL PROCEDURE H&P  History and Physical Interval Note:  04/13/2017 10:05 AM  Makeda L Sollers has presented today for their planned procedure. The various methods of treatment have been discussed with the patient and family. After consideration of risks, benefits and other options for treatment, the patient has consented to the procedure.  The patients' outpatient history has been reviewed, patient examined, and no change in status from most recent office note within the past 30 days. I have reviewed the patients' chart and labs and will proceed as planned. Questions were answered to the patient's satisfaction.   Chrystie Nose, MD, [facility]  [place]  [facility]  Attending Cardiologist  Direct Dial: [phone]  Fax: [phone]  Website:  [email or web address] 04/13/2017, 10:05 AM

## 2017-04-13 NOTE — Transfer of Care (Signed)
Immediate Anesthesia Transfer of Care Note  Patient: Kayla Zuniga  Procedure(s) Performed: CARDIOVERSION (N/A )  Patient Location: PACU and Endoscopy Unit  Anesthesia Type:General  Level of Consciousness: awake, alert , oriented and patient cooperative  Airway & Oxygen Therapy: Patient Spontanous Breathing and Patient connected to nasal cannula oxygen  Post-op Assessment: Report given to RN, Post -op Vital signs reviewed and stable and Patient moving all extremities X 4  Post vital signs: Reviewed and stable  Last Vitals:  Vitals:   04/13/17 1021 04/13/17 1022  BP:    Pulse: (!) 49 (!) 52  Resp: (!) 21 (!) 25  Temp:    SpO2: 98% 98%    Last Pain:  Vitals:   04/13/17 0937  TempSrc: Oral         Complications: No apparent anesthesia complications

## 2017-04-13 NOTE — Anesthesia Preprocedure Evaluation (Signed)
Anesthesia Evaluation  Patient identified by MRN, date of birth, ID band Patient awake    Reviewed: Allergy & Precautions, NPO status , Patient's Chart, lab work & pertinent test results  Airway Mallampati: II  TM Distance: >3 FB Neck ROM: Full    Dental no notable dental hx. (+) Teeth Intact, Caps   Pulmonary neg pulmonary ROS,    Pulmonary exam normal breath sounds clear to auscultation       Cardiovascular hypertension, Pt. on medications and Pt. on home beta blockers Normal cardiovascular exam+ dysrhythmias Atrial Fibrillation  Rhythm:Regular Rate:Normal     Neuro/Psych negative neurological ROS  negative psych ROS   GI/Hepatic Neg liver ROS,   Endo/Other  diabetes, Well Controlled, Type 2, Oral Hypoglycemic AgentsObesity   Renal/GU negative Renal ROS     Musculoskeletal negative musculoskeletal ROS (+)   Abdominal (+) + obese,   Peds  Hematology Eliquis- last dose this am   Anesthesia Other Findings   Reproductive/Obstetrics                             Anesthesia Physical Anesthesia Plan  ASA: III  Anesthesia Plan: General   Post-op Pain Management:    Induction: Intravenous  PONV Risk Score and Plan:   Airway Management Planned: Mask  Additional Equipment:   Intra-op Plan:   Post-operative Plan:   Informed Consent: I have reviewed the patients History and Physical, chart, labs and discussed the procedure including the risks, benefits and alternatives for the proposed anesthesia with the patient or authorized representative who has indicated his/her understanding and acceptance.   Dental advisory given  Plan Discussed with: Anesthesiologist, CRNA and Surgeon  Anesthesia Plan Comments:         Anesthesia Quick Evaluation

## 2017-04-13 NOTE — Anesthesia Postprocedure Evaluation (Signed)
Anesthesia Post Note  Patient: Kayla Zuniga  Procedure(s) Performed: CARDIOVERSION (N/A )     Patient location during evaluation: PACU Anesthesia Type: General Level of consciousness: awake and alert Pain management: pain level controlled Vital Signs Assessment: post-procedure vital signs reviewed and stable Respiratory status: spontaneous breathing, nonlabored ventilation and respiratory function stable Cardiovascular status: blood pressure returned to baseline and stable Postop Assessment: no apparent nausea or vomiting Anesthetic complications: no    Last Vitals:  Vitals:   04/13/17 1027 04/13/17 1035  BP: (!) 116/52 (!) 139/55  Pulse: (!) 51 84  Resp: 16 20  Temp: (P) 36.4 C   SpO2: 98% 99%    Last Pain:  Vitals:   04/13/17 1027  TempSrc: (P) Oral                 Saman Giddens A.

## 2017-04-13 NOTE — CV Procedure (Signed)
   CARDIOVERSION NOTE  Procedure: Electrical Cardioversion Indications:  Atrial Fibrillation  Procedure Details:  Consent: Risks of procedure as well as the alternatives and risks of each were explained to the (patient/caregiver).  Consent for procedure obtained.  Time Out: Verified patient identification, verified procedure, site/side was marked, verified correct patient position, special equipment/implants available, medications/allergies/relevent history reviewed, required imaging and test results available.  Performed  Patient placed on cardiac monitor, pulse oximetry, supplemental oxygen as necessary.  Sedation given: Propofol per anesthesia Pacer pads placed anterior and posterior chest.  Cardioverted 1 time(s).  Cardioverted at 120J biphasic.  Impression: Findings: Post procedure EKG shows: NSR Complications: None Patient did tolerate procedure well.  Plan: 1. Successful DCCV with a single 120J biphasic shock to NSR. 2. Follow-up with Rudi Coco, NP, in the a-fib clinic.  Time Spent Directly with the Patient:  30 minutes   Chrystie Nose, MD, [facility]  [place]  [facility]  Attending Cardiologist  Direct Dial: [phone]  Fax: [phone]  Website:  [email or web address] 04/13/2017, 10:28 AM

## 2017-04-15 ENCOUNTER — Encounter (HOSPITAL_COMMUNITY): Payer: Self-pay | Admitting: Internal Medicine

## 2017-04-18 ENCOUNTER — Ambulatory Visit (HOSPITAL_COMMUNITY)
Admission: RE | Admit: 2017-04-18 | Discharge: 2017-04-18 | Disposition: A | Discharge status: Home / Self Care | Payer: Medicare Other | Source: Ambulatory Visit | Attending: Nurse Practitioner | Admitting: Nurse Practitioner

## 2017-04-18 VITALS — BP 134/82 | HR 139 | Ht 60.0 in | Wt 193.0 lb

## 2017-04-18 DIAGNOSIS — I481 Persistent atrial fibrillation: Secondary | ICD-10-CM | POA: Insufficient documentation

## 2017-04-18 DIAGNOSIS — Z880 Allergy status to penicillin: Secondary | ICD-10-CM | POA: Diagnosis not present

## 2017-04-18 DIAGNOSIS — I10 Essential (primary) hypertension: Secondary | ICD-10-CM | POA: Insufficient documentation

## 2017-04-18 DIAGNOSIS — E119 Type 2 diabetes mellitus without complications: Secondary | ICD-10-CM | POA: Insufficient documentation

## 2017-04-18 DIAGNOSIS — Z7984 Long term (current) use of oral hypoglycemic drugs: Secondary | ICD-10-CM | POA: Diagnosis not present

## 2017-04-18 DIAGNOSIS — Z79899 Other long term (current) drug therapy: Secondary | ICD-10-CM | POA: Diagnosis not present

## 2017-04-18 DIAGNOSIS — R635 Abnormal weight gain: Secondary | ICD-10-CM | POA: Insufficient documentation

## 2017-04-18 DIAGNOSIS — Z888 Allergy status to other drugs, medicaments and biological substances status: Secondary | ICD-10-CM | POA: Insufficient documentation

## 2017-04-18 DIAGNOSIS — Z882 Allergy status to sulfonamides status: Secondary | ICD-10-CM | POA: Insufficient documentation

## 2017-04-18 DIAGNOSIS — Z9049 Acquired absence of other specified parts of digestive tract: Secondary | ICD-10-CM | POA: Diagnosis not present

## 2017-04-18 DIAGNOSIS — Z7901 Long term (current) use of anticoagulants: Secondary | ICD-10-CM | POA: Insufficient documentation

## 2017-04-18 DIAGNOSIS — Z7982 Long term (current) use of aspirin: Secondary | ICD-10-CM | POA: Diagnosis not present

## 2017-04-18 DIAGNOSIS — I4819 Other persistent atrial fibrillation: Secondary | ICD-10-CM

## 2017-04-18 DIAGNOSIS — Z9889 Other specified postprocedural states: Secondary | ICD-10-CM | POA: Insufficient documentation

## 2017-04-18 MED ORDER — DILTIAZEM HCL ER COATED BEADS 120 MG PO CP24: 120.0000 mg | ORAL_CAPSULE | Freq: Every day | ORAL | 6 refills | Status: DC

## 2017-04-18 MED ORDER — POTASSIUM CHLORIDE ER 10 MEQ PO TBCR: 20.0000 meq | EXTENDED_RELEASE_TABLET | Freq: Every day | ORAL | 3 refills | Status: DC

## 2017-04-18 MED ORDER — FUROSEMIDE 20 MG PO TABS: 40.0000 mg | ORAL_TABLET | Freq: Every day | ORAL | 2 refills | Status: DC

## 2017-04-18 NOTE — Patient Instructions (Signed)
Your physician has recommended you make the following change in your medication:  1)Stop Amlodipine (Norvasc) 2)Start Cardizem 120mg  once a day (start this tomorrow) 3)Increase lasix to 40mg  a day -- take a 20mg  tablet when you get home today 4)Start Potassium (KDur) 20meq a day (2 of the 10meq tabs)

## 2017-04-18 NOTE — Progress Notes (Signed)
Primary Care Physician: Laurann MontanaWhite, Cynthia, MD Referring Physician: Dr. Merri Brunetteandace Smith   Kayla Zuniga is a 83 y.o. female with a h/o HTN, DM, that was having U/S on her extremities amd it was noted that she had an irregular heart beat from which pt was asymptomatic. She was asked to f/u with her PCP and saw Dr. Basil Dessandicee Smith filling in for Dr. Cliffton AstersWhite, her regular PCP. She was found to be in afib at 114 bpm. She was started on Eliquis 5 mg bid, 9/13, for a chadsvasc, score of 5 (agex2, HTN, female, DM) She denies a snoring history, no tobacco, no excessive caffeine, one Mint Julip a year on [place] Day.  F/u in afib clinic, 10/2, to evaluate increase in bystolic for rate control. She still is with rvr at 121 beats per min. She had noted increase in ankle edema and weight is up 4 lbs. States that HR at home is running slightly over 100. She does not note any fatigue or shortness of breath.Echo as of yesterday showed normal heart function and structure.  F/u in afib clinic 10/5. Her weight is down 3 lbs since starting lasix. Ankle edema improved. HR is 99 and has been under 100 bpm at  home. She has now been on anticoagulation  X 3 weeks without missed doses since 9/13. Cardioversion discussed with pt and she would like to pursue this.   F/u in afib clinic, 10/17.She had successful cardioversion 10/12 but initially did not not any difference  in SR, but has not felt good for the last couple of days. Today, she is in afib at 139 bpm, her weight is up 9 lbs. Does have Shortness of breath with walking, but no PND orthopnea. Increase in LLE.  Today, she denies symptoms of palpitations, chest pain,   orthopnea, PND  dizziness, presyncope, syncope, or neurologic sequela. Positive for weight gain and ankle edema, fatigue. The patient is tolerating medications without difficulties and is otherwise without complaint today.   Past Medical History:  Diagnosis Date  . Diabetes mellitus without complication (HCC)     . Hypertension    Past Surgical History:  Procedure Laterality Date  . BUNIONECTOMY    . CARDIOVERSION N/A 04/13/2017   Procedure: CARDIOVERSION;  Surgeon: Chrystie NoseHilty, Kenneth C, MD;  [facility];  Service: Cardiovascular;  Laterality: N/A;  . CHOLECYSTECTOMY  1973  . DILATION AND CURETTAGE, DIAGNOSTIC / THERAPEUTIC  1980  . TONSILLECTOMY     age 54    Current Outpatient Prescriptions  Medication Sig Dispense Refill  . apixaban (ELIQUIS) 5 MG TABS tablet Take 1 tablet (5 mg total) by mouth 2 (two) times daily. 180 tablet 2  . aspirin EC 81 MG tablet Take 81 mg by mouth daily.    Marland Kitchen. atorvastatin (LIPITOR) 10 MG tablet 1 tablet daily.    Marland Kitchen. BYSTOLIC 5 MG tablet Take 2 tablets (10 mg total) by mouth daily. 30 tablet   . Cholecalciferol (VITAMIN D3) 3000 units TABS Take by mouth.    . furosemide (LASIX) 20 MG tablet Take 2 tablets (40 mg total) by mouth daily. 30 tablet 2  . metFORMIN (GLUCOPHAGE-XR) 500 MG 24 hr tablet 4 tablets daily.    . Multiple Vitamins-Minerals (ICAPS AREDS 2 PO) Take by mouth.    . Multiple Vitamins-Minerals (MULTIVITAMIN WITH MINERALS) tablet Take 1 tablet by mouth daily.    . Omega-3 Fatty Acids (FISH OIL) 1000 MG CAPS Take by mouth.    . valsartan (DIOVAN) 320  MG tablet Take 1 tablet (320 mg total) by mouth daily. 30 tablet 2  . diltiazem (CARDIZEM CD) 120 MG 24 hr capsule Take 1 capsule (120 mg total) by mouth daily. 30 capsule 6  . potassium chloride (K-DUR) 10 MEQ tablet Take 2 tablets (20 mEq total) by mouth daily. 60 tablet 3   No current facility-administered medications for this encounter.     Allergies  Allergen Reactions  . Penicillins Hives and Itching  . Sulfa Antibiotics Itching  . Prolia [Denosumab]     hallucinations    Social History   Social History  . Marital status: Single    Spouse name: N/A  . Number of children: N/A  . Years of education: N/A   Occupational History  . Not on file.   Social History Main Topics  .  Smoking status: Never Smoker  . Smokeless tobacco: Never Used  . Alcohol use Yes     Comment: rarely  . Drug use: Unknown  . Sexual activity: Not on file   Other Topics Concern  . Not on file   Social History Narrative  . No narrative on file    No family history on file.  ROS- All systems are reviewed and negative except as per the HPI above  Physical Exam: Vitals:   04/18/17 1103  BP: 134/82  Pulse: (!) 139  Weight: 193 lb (87.5 kg)  Height: 5' (1.524 m)   Wt Readings from Last 3 Encounters:  04/18/17 193 lb (87.5 kg)  04/13/17 185 lb (83.9 kg)  04/06/17 185 lb 9.6 oz (84.2 kg)    Labs: Lab Results  Component Value Date   NA 138 04/06/2017   K 3.9 04/06/2017   CL 100 (L) 04/06/2017   CO2 30 04/06/2017   GLUCOSE 121 (H) 04/06/2017   BUN 17 04/06/2017   CREATININE 0.93 04/06/2017   CALCIUM 9.4 04/06/2017   MG 1.6 (L) 04/03/2017   No results found for: INR No results found for: CHOL, HDL, LDLCALC, TRIG   GEN- The patient is well appearing, alert and oriented x 3 today.   Head- normocephalic, atraumatic Eyes-  Sclera clear, conjunctiva pink Ears- hearing intact Oropharynx- clear Neck- supple, no JVP Lymph- no cervical lymphadenopathy Lungs- Clear to ausculation bilaterally, normal work of breathing Heart- irregular rate and rhythm, no murmurs, rubs or gallops, PMI not laterally displaced GI- soft, NT, ND, + BS Extremities- no clubbing, cyanosis, or edema MS- no significant deformity or atrophy Skin- no rash or lesion Psych- euthymic mood, full affect Neuro- strength and sensation are intact  EKG- Afib  at 139 bpm, qrs int 78 ms, qtc 404 ms Epic records reviewed Echo- 10/1-Study Conclusions  - Left ventricle: The cavity size was normal. Wall thickness was   normal. Systolic function was normal. The estimated ejection   fraction was in the range of 60% to 65%. - Mitral valve: There was mild regurgitation. - Pulmonary arteries: PA peak pressure: 37  mm Hg (S).  Assessment and Plan: 1. Persistent  Afib Successful cardioversion 10/12 but with ERAF and RVR Continue  Bystolic at 10 mg daily   Stop amlodipine and start cardizem 120 mg qd Continue Eliquis 5 mg bid for chadsvasc score of 5   2. LLE/7 lb weight gain Initially  improved with lasix, likely 2/2 afib Increase to  40 mg a day, take an extra 20 mg today on arriving home, as she has already taken her am dose Start k+ 10 meq x2 a  day while on higher dose of lasix Daily weights  2. HTN stable  3. DM Under good control per pt   She will call office with BP/HR readings on Friday with weights. If no significant weight loss by Friday, will increase lasix to 40 mg am and 20 mg pm for Fri/sat/sun. I will see back on Tuesday If shortness of breath worsens or if PND/orthopnea develop, go to ER  Lupita Leash C. [facility] [address] [place], [place] [address] [phone]

## 2017-04-20 ENCOUNTER — Telehealth (HOSPITAL_COMMUNITY): Payer: Self-pay | Admitting: *Deleted

## 2017-04-20 NOTE — Telephone Encounter (Signed)
Patient called in stating she is feeling much better with her breathing -- she is down 8lbs. HR is still running in the 100-110 range - she was not able to pick up cardizem from pharmacy due to her mail order picking up the prescription unknown to her while the power was out. She is working on getting a short supply from the pharmacy until her mail order arrives on Monday. Pt states she will call back if further issues before Tuesday.

## 2017-04-24 ENCOUNTER — Ambulatory Visit (HOSPITAL_COMMUNITY)
Admission: RE | Admit: 2017-04-24 | Discharge: 2017-04-24 | Disposition: A | Discharge status: Home / Self Care | Payer: Medicare Other | Source: Ambulatory Visit | Attending: Nurse Practitioner | Admitting: Nurse Practitioner

## 2017-04-24 ENCOUNTER — Encounter (HOSPITAL_COMMUNITY): Payer: Self-pay | Admitting: Nurse Practitioner

## 2017-04-24 VITALS — BP 126/78 | HR 85 | Ht 60.0 in | Wt 180.0 lb

## 2017-04-24 DIAGNOSIS — Z9889 Other specified postprocedural states: Secondary | ICD-10-CM | POA: Diagnosis not present

## 2017-04-24 DIAGNOSIS — Z7902 Long term (current) use of antithrombotics/antiplatelets: Secondary | ICD-10-CM | POA: Insufficient documentation

## 2017-04-24 DIAGNOSIS — I481 Persistent atrial fibrillation: Secondary | ICD-10-CM | POA: Diagnosis present

## 2017-04-24 DIAGNOSIS — I10 Essential (primary) hypertension: Secondary | ICD-10-CM | POA: Diagnosis not present

## 2017-04-24 DIAGNOSIS — Z888 Allergy status to other drugs, medicaments and biological substances status: Secondary | ICD-10-CM | POA: Insufficient documentation

## 2017-04-24 DIAGNOSIS — I4819 Other persistent atrial fibrillation: Secondary | ICD-10-CM

## 2017-04-24 DIAGNOSIS — Z7982 Long term (current) use of aspirin: Secondary | ICD-10-CM | POA: Diagnosis not present

## 2017-04-24 DIAGNOSIS — Z7984 Long term (current) use of oral hypoglycemic drugs: Secondary | ICD-10-CM | POA: Insufficient documentation

## 2017-04-24 DIAGNOSIS — Z9049 Acquired absence of other specified parts of digestive tract: Secondary | ICD-10-CM | POA: Insufficient documentation

## 2017-04-24 DIAGNOSIS — Z882 Allergy status to sulfonamides status: Secondary | ICD-10-CM | POA: Diagnosis not present

## 2017-04-24 DIAGNOSIS — Z880 Allergy status to penicillin: Secondary | ICD-10-CM | POA: Insufficient documentation

## 2017-04-24 DIAGNOSIS — Z79899 Other long term (current) drug therapy: Secondary | ICD-10-CM | POA: Diagnosis not present

## 2017-04-24 DIAGNOSIS — E119 Type 2 diabetes mellitus without complications: Secondary | ICD-10-CM | POA: Diagnosis not present

## 2017-04-24 LAB — BASIC METABOLIC PANEL
ANION GAP: 12 (ref 5–15)
BUN: 16 mg/dL (ref 6–20)
CHLORIDE: 97 mmol/L — AB (ref 101–111)
CO2: 29 mmol/L (ref 22–32)
Calcium: 9.2 mg/dL (ref 8.9–10.3)
Creatinine, Ser: 1.04 mg/dL — ABNORMAL HIGH (ref 0.44–1.00)
GFR calc Af Amer: 57 mL/min — ABNORMAL LOW (ref >60)
GFR calc non Af Amer: 49 mL/min — ABNORMAL LOW (ref >60)
GLUCOSE: 182 mg/dL — AB (ref 65–99)
POTASSIUM: 3.8 mmol/L (ref 3.5–5.1)
Sodium: 138 mmol/L (ref 135–145)

## 2017-04-24 MED ORDER — FUROSEMIDE 20 MG PO TABS: 20.0000 mg | ORAL_TABLET | Freq: Every day | ORAL | 2 refills | Status: DC

## 2017-04-24 NOTE — Progress Notes (Signed)
Primary Care Physician: Laurann Montana, MD Referring Physician: Dr. Merri Brunette   Kayla Zuniga is a 83 y.o. female with a h/o HTN, DM, that was having U/S on her extremities amd it was noted that she had an irregular heart beat from which pt was asymptomatic. She was asked to f/u with her PCP and saw Dr. Basil Dess filling in for Dr. Cliffton Asters, her regular PCP. She was found to be in afib at 114 bpm. She was started on Eliquis 5 mg bid, 9/13, for a chadsvasc, score of 5 (agex2, HTN, female, DM) She denies a snoring history, no tobacco, no excessive caffeine, one Mint Julip a year on [place].  F/u in afib clinic, 10/2, to evaluate increase in bystolic for rate control. She still is with rvr at 121 beats per min. She had noted increase in ankle edema and weight is up 4 lbs. States that HR at home is running slightly over 100. She does not note any fatigue or shortness of breath.Echo as of yesterday showed normal heart function and structure.  F/u in afib clinic 10/5. Her weight is down 3 lbs since starting lasix. Ankle edema improved. HR is 99 and has been under 100 bpm at  home. She has now been on anticoagulation  X 3 weeks without missed doses since 9/13. Cardioversion discussed with pt and she would like to pursue this.   F/u in afib clinic, 10/17.She had successful cardioversion 10/12 but initially did not notice any difference  in SR, but has not felt good for the last couple of days. Today, she is in afib at 139 bpm, her weight is up 9 lbs. Does have shortness of breath with walking, but no PND orthopnea, increase in LLE.  F/u in afib clinic, 10/23. She is rate controlled in the mid 80's and has diuresed 10-13 lbs. She feels so much better, back to her baseline.  Today, she denies symptoms of palpitations, chest pain,   orthopnea, PND  dizziness, presyncope, syncope, or neurologic sequela. Positive for weight gain and ankle edema, fatigue. The patient is tolerating medications without  difficulties and is otherwise without complaint today.   Past Medical History:  Diagnosis Date  . Diabetes mellitus without complication (HCC)   . Hypertension    Past Surgical History:  Procedure Laterality Date  . BUNIONECTOMY    . CARDIOVERSION N/A 04/13/2017   Procedure: CARDIOVERSION;  Surgeon: Chrystie Nose, MD;  Location: [facility] ENDOSCOPY;  Service: Cardiovascular;  Laterality: N/A;  . CHOLECYSTECTOMY  1973  . DILATION AND CURETTAGE, DIAGNOSTIC / THERAPEUTIC  1980  . TONSILLECTOMY     age 23    Current Outpatient Prescriptions  Medication Sig Dispense Refill  . apixaban (ELIQUIS) 5 MG TABS tablet Take 1 tablet (5 mg total) by mouth 2 (two) times daily. 180 tablet 2  . aspirin EC 81 MG tablet Take 81 mg by mouth daily.    Marland Kitchen atorvastatin (LIPITOR) 10 MG tablet 1 tablet daily.    Marland Kitchen BYSTOLIC 5 MG tablet Take 2 tablets (10 mg total) by mouth daily. 30 tablet   . Cholecalciferol (VITAMIN D3) 3000 units TABS Take by mouth.    . diltiazem (CARDIZEM CD) 120 MG 24 hr capsule Take 1 capsule (120 mg total) by mouth daily. 30 capsule 6  . furosemide (LASIX) 20 MG tablet Take 1 tablet (20 mg total) by mouth daily. 30 tablet 2  . metFORMIN (GLUCOPHAGE-XR) 500 MG 24 hr tablet 4 tablets daily.    Marland Kitchen  Multiple Vitamins-Minerals (ICAPS AREDS 2 PO) Take by mouth.    . Multiple Vitamins-Minerals (MULTIVITAMIN WITH MINERALS) tablet Take 1 tablet by mouth daily.    . Omega-3 Fatty Acids (FISH OIL) 1000 MG CAPS Take by mouth.    . potassium chloride (K-DUR) 10 MEQ tablet Take 2 tablets (20 mEq total) by mouth daily. 60 tablet 3  . valsartan (DIOVAN) 320 MG tablet Take 1 tablet (320 mg total) by mouth daily. 30 tablet 2   No current facility-administered medications for this encounter.     Allergies  Allergen Reactions  . Penicillins Hives and Itching  . Sulfa Antibiotics Itching  . Prolia [Denosumab]     hallucinations    Social History   Social History  . Marital status: Single    Spouse  name: N/A  . Number of children: N/A  . Years of education: N/A   Occupational History  . Not on file.   Social History Main Topics  . Smoking status: Never Smoker  . Smokeless tobacco: Never Used  . Alcohol use Yes     Comment: rarely  . Drug use: Unknown  . Sexual activity: Not on file   Other Topics Concern  . Not on file   Social History Narrative  . No narrative on file    No family history on file.  ROS- All systems are reviewed and negative except as per the HPI above  Physical Exam: Vitals:   04/24/17 1003  BP: 126/78  Pulse: 85  Weight: 180 lb (81.6 kg)  Height: 5' (1.524 m)   Wt Readings from Last 3 Encounters:  04/24/17 180 lb (81.6 kg)  04/18/17 193 lb (87.5 kg)  04/13/17 185 lb (83.9 kg)    Labs: Lab Results  Component Value Date   NA 138 04/24/2017   K 3.8 04/24/2017   CL 97 (L) 04/24/2017   CO2 29 04/24/2017   GLUCOSE 182 (H) 04/24/2017   BUN 16 04/24/2017   CREATININE 1.04 (H) 04/24/2017   CALCIUM 9.2 04/24/2017   MG 1.6 (L) 04/03/2017   No results found for: INR No results found for: CHOL, HDL, LDLCALC, TRIG   GEN- The patient is well appearing, alert and oriented x 3 today.   Head- normocephalic, atraumatic Eyes-  Sclera clear, conjunctiva pink Ears- hearing intact Oropharynx- clear Neck- supple, no JVP Lymph- no cervical lymphadenopathy Lungs- Clear to ausculation bilaterally, normal work of breathing Heart- irregular rate and rhythm, no murmurs, rubs or gallops, PMI not laterally displaced GI- soft, NT, ND, + BS Extremities- no clubbing, cyanosis, or edema MS- no significant deformity or atrophy Skin- no rash or lesion Psych- euthymic mood, full affect Neuro- strength and sensation are intact  EKG- Afib  at 86 bpm, qrs int 78 ms, qtc397 ms Epic records reviewed Echo- 10/1-Study Conclusions  - Left ventricle: The cavity size was normal. Wall thickness was   normal. Systolic function was normal. The estimated ejection    fraction was in the range of 60% to 65%. - Mitral valve: There was mild regurgitation. - Pulmonary arteries: PA peak pressure: 37 mm Hg (S).  Assessment and Plan: 1. Persistent  Afib Successful cardioversion 10/12 but with ERAF and RVR Continue  Bystolic at 10 mg daily   Stop amlodipine and now on  cardizem 120 mg qd Continue Eliquis 5 mg bid for chadsvasc score of 5   2. LLE/7 lb weight gain Has diuresed 10-13 lbs fluid and feels back to her baseline Decrease lasix back  to 20 mg a day, watch for any fluid retention with lowering dose of lasix   2. HTN stable  3. DM Under good control per pt   If she can continue with rate control and weight stays normal, with normal heart function and feeling well, anticipate that she may live in afib with continued anticoagulation.  F/u in 2 weeks  Elvina Sidle. [facility] [address] [place], [place] [address] [phone]

## 2017-04-24 NOTE — Patient Instructions (Signed)
Your physician has recommended you make the following change in your medication: 1)Decrease lasix 20mg  once a day

## 2017-05-09 ENCOUNTER — Encounter (HOSPITAL_COMMUNITY): Payer: Self-pay | Admitting: Nurse Practitioner

## 2017-05-09 ENCOUNTER — Ambulatory Visit (HOSPITAL_COMMUNITY)
Admission: RE | Admit: 2017-05-09 | Discharge: 2017-05-09 | Disposition: A | Discharge status: Home / Self Care | Payer: Medicare Other | Source: Ambulatory Visit | Attending: Nurse Practitioner | Admitting: Nurse Practitioner

## 2017-05-09 VITALS — BP 132/82 | HR 105 | Ht 60.0 in | Wt 181.6 lb

## 2017-05-09 DIAGNOSIS — Z7984 Long term (current) use of oral hypoglycemic drugs: Secondary | ICD-10-CM | POA: Diagnosis not present

## 2017-05-09 DIAGNOSIS — Z7901 Long term (current) use of anticoagulants: Secondary | ICD-10-CM | POA: Insufficient documentation

## 2017-05-09 DIAGNOSIS — I481 Persistent atrial fibrillation: Secondary | ICD-10-CM | POA: Diagnosis not present

## 2017-05-09 DIAGNOSIS — I4819 Other persistent atrial fibrillation: Secondary | ICD-10-CM

## 2017-05-09 DIAGNOSIS — E119 Type 2 diabetes mellitus without complications: Secondary | ICD-10-CM | POA: Diagnosis not present

## 2017-05-09 DIAGNOSIS — I10 Essential (primary) hypertension: Secondary | ICD-10-CM | POA: Insufficient documentation

## 2017-05-09 DIAGNOSIS — Z880 Allergy status to penicillin: Secondary | ICD-10-CM | POA: Insufficient documentation

## 2017-05-09 DIAGNOSIS — Z79899 Other long term (current) drug therapy: Secondary | ICD-10-CM | POA: Insufficient documentation

## 2017-05-09 NOTE — Progress Notes (Signed)
Primary Care Physician: Laurann MontanaWhite, Cynthia, MD Referring Physician: Dr. Merri Brunetteandace Smith   Theressa Millardwyla L Zuniga is a 83 y.o. female with a h/o HTN, DM, that was having U/S on her extremities amd it was noted that she had an irregular heart beat from which pt was asymptomatic. She was asked to f/u with her PCP and saw Dr. Basil Dessandicee Smith filling in for Dr. Cliffton AstersWhite, her regular PCP. She was found to be in afib at 114 bpm. She was started on Eliquis 5 mg bid, 9/13, for a chadsvasc, score of 5 (agex2, HTN, female, DM) She denies a snoring history, no tobacco, no excessive caffeine, one Mint Julip a year on [place].  F/u in afib clinic, 10/2, to evaluate increase in bystolic for rate control. She still is with rvr at 121 beats per min. She had noted increase in ankle edema and weight is up 4 lbs. States that HR at home is running slightly over 100. She does not note any fatigue or shortness of breath.Echo as of yesterday showed normal heart function and structure.  F/u in afib clinic 10/5. Her weight is down 3 lbs since starting lasix. Ankle edema improved. HR is 99 and has been under 100 bpm at  home. She has now been on anticoagulation  X 3 weeks without missed doses since 9/13. Cardioversion discussed with pt and she would like to pursue this.   F/u in afib clinic, 10/17.She had successful cardioversion 10/12 but initially did not notice any difference  in SR, but has not felt good for the last couple of days. Today, she is in afib at 139 bpm, her weight is up 9 lbs. Does have shortness of breath with walking, but no PND orthopnea, increase in LLE.  F/u in afib clinic, 10/23. She is rate controlled in the mid 80's and has diuresed 10-13 lbs. She feels so much better, back to her baseline.  F/u in afib clinic, 05/16/17, she is feels good. States that she does not feel much different in afib as SR and still wants to opt for rate control Weight is stable on lasix 20 mg qd. HR around 100 bpm here but pt checks  frequently at home and it is usually in the 80's.  Today, she denies symptoms of palpitations, chest pain,   orthopnea, PND  dizziness, presyncope, syncope, or neurologic sequela. Positive for weight gain and ankle edema, fatigue. The patient is tolerating medications without difficulties and is otherwise without complaint today.   Past Medical History:  Diagnosis Date  . Diabetes mellitus without complication (HCC)   . Hypertension    Past Surgical History:  Procedure Laterality Date  . BUNIONECTOMY    . CHOLECYSTECTOMY  1973  . DILATION AND CURETTAGE, DIAGNOSTIC / THERAPEUTIC  1980  . TONSILLECTOMY     age 80    Current Outpatient Medications  Medication Sig Dispense Refill  . apixaban (ELIQUIS) 5 MG TABS tablet Take 1 tablet (5 mg total) by mouth 2 (two) times daily. 180 tablet 2  . atorvastatin (LIPITOR) 10 MG tablet 1 tablet daily.    Marland Kitchen. BYSTOLIC 5 MG tablet Take 2 tablets (10 mg total) by mouth daily. 30 tablet   . Cholecalciferol (VITAMIN D3) 3000 units TABS Take by mouth.    . diltiazem (CARDIZEM CD) 120 MG 24 hr capsule Take 1 capsule (120 mg total) by mouth daily. 30 capsule 6  . furosemide (LASIX) 20 MG tablet Take 1 tablet (20 mg total) by mouth daily. 30  tablet 2  . metFORMIN (GLUCOPHAGE-XR) 500 MG 24 hr tablet 4 tablets daily.    . Multiple Vitamins-Minerals (ICAPS AREDS 2 PO) Take by mouth.    . Multiple Vitamins-Minerals (MULTIVITAMIN WITH MINERALS) tablet Take 1 tablet by mouth daily.    . Omega-3 Fatty Acids (FISH OIL) 1000 MG CAPS Take by mouth.    . potassium chloride (K-DUR) 10 MEQ tablet Take 2 tablets (20 mEq total) by mouth daily. 60 tablet 3  . valsartan (DIOVAN) 320 MG tablet Take 1 tablet (320 mg total) by mouth daily. 30 tablet 2   No current facility-administered medications for this encounter.     Allergies  Allergen Reactions  . Penicillins Hives and Itching  . Sulfa Antibiotics Itching  . Prolia [Denosumab]     hallucinations    Social History    Socioeconomic History  . Marital status: Single    Spouse name: Not on file  . Number of children: Not on file  . Years of education: Not on file  . Highest education level: Not on file  Social Needs  . Financial resource strain: Not on file  . Food insecurity - worry: Not on file  . Food insecurity - inability: Not on file  . Transportation needs - medical: Not on file  . Transportation needs - non-medical: Not on file  Occupational History  . Not on file  Tobacco Use  . Smoking status: Never Smoker  . Smokeless tobacco: Never Used  Substance and Sexual Activity  . Alcohol use: Yes    Comment: rarely  . Drug use: Not on file  . Sexual activity: Not on file  Other Topics Concern  . Not on file  Social History Narrative  . Not on file    No family history on file.  ROS- All systems are reviewed and negative except as per the HPI above  Physical Exam: Vitals:   05/09/17 1039  BP: 132/82  Pulse: (!) 105  Weight: 181 lb 9.6 oz (82.4 kg)  Height: 5' (1.524 m)   Wt Readings from Last 3 Encounters:  05/09/17 181 lb 9.6 oz (82.4 kg)  04/24/17 180 lb (81.6 kg)  04/18/17 193 lb (87.5 kg)    Labs: Lab Results  Component Value Date   NA 138 04/24/2017   K 3.8 04/24/2017   CL 97 (L) 04/24/2017   CO2 29 04/24/2017   GLUCOSE 182 (H) 04/24/2017   BUN 16 04/24/2017   CREATININE 1.04 (H) 04/24/2017   CALCIUM 9.2 04/24/2017   MG 1.6 (L) 04/03/2017   No results found for: INR No results found for: CHOL, HDL, LDLCALC, TRIG   GEN- The patient is well appearing, alert and oriented x 3 today.   Head- normocephalic, atraumatic Eyes-  Sclera clear, conjunctiva pink Ears- hearing intact Oropharynx- clear Neck- supple, no JVP Lymph- no cervical lymphadenopathy Lungs- Clear to ausculation bilaterally, normal work of breathing Heart- irregular rate and rhythm, no murmurs, rubs or gallops, PMI not laterally displaced GI- soft, NT, ND, + BS Extremities- no clubbing,  cyanosis, or edema MS- no significant deformity or atrophy Skin- no rash or lesion Psych- euthymic mood, full affect Neuro- strength and sensation are intact  EKG- Afib  at 105 bpm, qrs int 72 ms, qtc 422 ms Epic records reviewed Echo- 10/1-Study Conclusions  - Left ventricle: The cavity size was normal. Wall thickness was   normal. Systolic function was normal. The estimated ejection   fraction was in the range of 60% to  65%. - Mitral valve: There was mild regurgitation. - Pulmonary arteries: PA peak pressure: 37 mm Hg (S).  Assessment and Plan: 1. Persistent  Afib Successful cardioversion 10/12 but with ERAF and RVR  Now rate controlled and pt minimally symptomatic and would like to stay with rate control strategy  Continue Bystolic at 10 mg daily   Off amlodipine and now on  cardizem 120 mg qd Continue Eliquis 5 mg bid for chadsvasc score of 5  Can stop asa  2. LLE/ weight gain Has diuresed 10-13 lbs fluid and feels back to her baseline, weight is stable Conitnue lasix back at 20 mg a day, watch for any fluid retention and report   2. HTN stable  3. DM Under good control per pt   If she can continue with rate control and weight stays normal, with normal heart function and feeling well, anticipate that she may live in afib with continued anticoagulation.  F/u with general cardiology in 2 months, will request for pt to f/u with Dr. Lind Covert C. [facility] [address] [place], [place] [address] [phone]

## 2017-05-09 NOTE — Patient Instructions (Signed)
Stop Aspirin  Scheduler will be in touch with you to make appointment with Dr. Duke Salviaandolph

## 2017-06-20 ENCOUNTER — Other Ambulatory Visit (HOSPITAL_COMMUNITY): Payer: Self-pay | Admitting: Nurse Practitioner

## 2017-08-09 ENCOUNTER — Encounter (INDEPENDENT_AMBULATORY_CARE_PROVIDER_SITE_OTHER): Payer: Self-pay

## 2017-08-09 ENCOUNTER — Encounter: Payer: Self-pay | Admitting: Cardiovascular Disease

## 2017-08-09 ENCOUNTER — Ambulatory Visit (INDEPENDENT_AMBULATORY_CARE_PROVIDER_SITE_OTHER): Payer: Medicare Other | Admitting: Cardiovascular Disease

## 2017-08-09 VITALS — BP 124/78 | HR 94 | Ht 60.0 in | Wt 179.0 lb

## 2017-08-09 DIAGNOSIS — I481 Persistent atrial fibrillation: Secondary | ICD-10-CM

## 2017-08-09 DIAGNOSIS — I10 Essential (primary) hypertension: Secondary | ICD-10-CM | POA: Diagnosis not present

## 2017-08-09 DIAGNOSIS — I4819 Other persistent atrial fibrillation: Secondary | ICD-10-CM

## 2017-08-09 NOTE — Progress Notes (Signed)
Cardiology Office Note   Date:  08/09/2017   ID:  Kayla Zuniga, DOB 11/03/1933, MRN [id number]  PCP:  Laurann Montana, MD  Cardiologist:   Chilton Si, MD   Chief Complaint  Patient presents with  . New Patient (Initial Visit)    atrial fib      History of Present Illness: Kayla Zuniga is an 84 y.o. female with paroxysmal atrial fibrillation, hypertension, and diabetes  here for follow-up.  Kayla Zuniga was initially seen 03/2017 in the atrial fibrillation clinic her PCP found her to be in atrial fibrillation.  She was completely asymptomatic.  She was started on Eliquis.  Thyroid function was normal.  Nebivolol was increased for rate control.  She had an echo 04/02/17 that revealed LVEF 60-65% and mild mitral regurgitation. She was started on lasix for LE edema.  She underwent DCCV 04/2017 but had no change in symptoms.  She had recurrent atrial fibrillation and volume overload after returning to afib.  She has not felt any palpitations, lower extremity edema, orthopnea, or PND.  She gets some exercise riding a stationary bike.  She also does housework, is able to walk up stairs and shots on her own.  Overall she has been feeling well.  She notes that her baseline heart rate is in the 50s.    Past Medical History:  Diagnosis Date  . Diabetes mellitus without complication (HCC)   . Hypertension     Past Surgical History:  Procedure Laterality Date  . BUNIONECTOMY    . CARDIOVERSION N/A 04/13/2017   Procedure: CARDIOVERSION;  Surgeon: Chrystie Nose, MD;  Location: [facility] ENDOSCOPY;  Service: Cardiovascular;  Laterality: N/A;  . CHOLECYSTECTOMY  1973  . DILATION AND CURETTAGE, DIAGNOSTIC / THERAPEUTIC  1980  . TONSILLECTOMY     age 83     Current Outpatient Medications  Medication Sig Dispense Refill  . apixaban (ELIQUIS) 5 MG TABS tablet Take 1 tablet (5 mg total) by mouth 2 (two) times daily. 180 tablet 2  . atorvastatin (LIPITOR) 10 MG tablet 1 tablet daily.    Marland Kitchen  BYSTOLIC 5 MG tablet Take 2 tablets (10 mg total) by mouth daily. 30 tablet   . Cholecalciferol (VITAMIN D3) 3000 units TABS Take by mouth.    . diltiazem (CARDIZEM CD) 120 MG 24 hr capsule Take 1 capsule (120 mg total) by mouth daily. 30 capsule 6  . furosemide (LASIX) 20 MG tablet Take 1 tablet (20 mg total) by mouth daily. 30 tablet 2  . furosemide (LASIX) 20 MG tablet Take 1 tablet (20 mg total) by mouth daily. May take extra tablet as needed for weight gain. 110 tablet 2  . metFORMIN (GLUCOPHAGE-XR) 500 MG 24 hr tablet 4 tablets daily.    . Multiple Vitamins-Minerals (ICAPS AREDS 2 PO) Take by mouth.    . Multiple Vitamins-Minerals (MULTIVITAMIN WITH MINERALS) tablet Take 1 tablet by mouth daily.    . Omega-3 Fatty Acids (FISH OIL) 1000 MG CAPS Take by mouth.    . potassium chloride (K-DUR) 10 MEQ tablet Take 2 tablets (20 mEq total) by mouth daily. 60 tablet 3  . valsartan (DIOVAN) 320 MG tablet Take 1 tablet (320 mg total) by mouth daily. 30 tablet 2   No current facility-administered medications for this visit.     Allergies:   Penicillins; Sulfa antibiotics; and Prolia [denosumab]    Social History:  The patient  reports that  has never smoked. she has never used smokeless  tobacco. She reports that she drinks alcohol.   Family History:  The patient's family history is not on file.    ROS:  Please see the history of present illness.   Otherwise, review of systems are positive for none.   All other systems are reviewed and negative.    PHYSICAL EXAM: VS:  BP 124/78   Pulse 94   Ht 5' (1.524 m)   Wt 179 lb (81.2 kg)   BMI 34.96 kg/m  , BMI Body mass index is 34.96 kg/m. GENERAL:  Well appearing HEENT:  Pupils equal round and reactive, fundi not visualized, oral mucosa unremarkable NECK:  No jugular venous distention, waveform within normal limits, carotid upstroke brisk and symmetric, no bruits, no thyromegaly LYMPHATICS:  No cervical adenopathy LUNGS:  Clear to  auscultation bilaterally HEART:  Irregularly irregular.   PMI not displaced or sustained,S1 and S2 within normal limits, no S3, no S4, no clicks, no rubs, no murmurs ABD:  Flat, positive bowel sounds normal in frequency in pitch, no bruits, no rebound, no guarding, no midline pulsatile mass, no hepatomegaly, no splenomegaly EXT:  2 plus pulses throughout, no edema, no cyanosis no clubbing SKIN:  No rashes no nodules NEURO:  Cranial nerves II through XII grossly intact, motor grossly intact throughout PSYCH:  Cognitively intact, oriented to person place and time   EKG:  EKG is ordered today. The ekg ordered today demonstrates atrial fibrillation.  Rate 94 bpm.  Non-specific ST-T changes.   Echo 04/02/17: Study Conclusions  - Left ventricle: The cavity size was normal. Wall thickness was   normal. Systolic function was normal. The estimated ejection   fraction was in the range of 60% to 65%. - Mitral valve: There was mild regurgitation. - Pulmonary arteries: PA peak pressure: 37 mm Hg (S).   Recent Labs: 04/03/2017: Magnesium 1.6 04/06/2017: Hemoglobin 13.1; Platelets 265; TSH 5.006 04/24/2017: BUN 16; Creatinine, Ser 1.04; Potassium 3.8; Sodium 138    Lipid Panel No results found for: CHOL, TRIG, HDL, CHOLHDL, VLDL, LDLCALC, LDLDIRECT    Wt Readings from Last 3 Encounters:  08/09/17 179 lb (81.2 kg)  05/09/17 181 lb 9.6 oz (82.4 kg)  04/24/17 180 lb (81.6 kg)      ASSESSMENT AND PLAN:  # Persistent atrial fibrillation: Kayla Zuniga remains in atrial fibrillation.  Her rate is controlled and she is asymptomatic.  Continue Eliquis, nebivolol, and diltiazem.   # Hypertension: Blood pressure well-controlled.  Continue nebivolol, diltiazem, Lasix, and losartan.    Current medicines are reviewed at length with the patient today.  The patient does not have concerns regarding medicines.  The following changes have been made:  no change  Labs/ tests ordered today include:  No  orders of the defined types were placed in this encounter.    Disposition:   FU with Javaris Wigington C. Duke Salviaandolph, MD, [facility] in 6 months.    This note was written with the assistance of speech recognition software.  Please excuse any transcriptional errors.  Signed, Johnathen Testa C. Duke Salviaandolph, MD, [facility]  08/09/2017 10:14 AM    [facility]

## 2017-08-09 NOTE — Patient Instructions (Signed)

## 2017-08-13 ENCOUNTER — Other Ambulatory Visit (HOSPITAL_COMMUNITY): Payer: Self-pay | Admitting: *Deleted

## 2017-08-13 MED ORDER — POTASSIUM CHLORIDE ER 10 MEQ PO TBCR: 20.0000 meq | EXTENDED_RELEASE_TABLET | Freq: Every day | ORAL | 3 refills | Status: DC

## 2017-09-19 ENCOUNTER — Encounter: Payer: Self-pay | Admitting: Cardiovascular Disease

## 2017-09-19 DIAGNOSIS — I10 Essential (primary) hypertension: Secondary | ICD-10-CM

## 2017-09-19 HISTORY — DX: Essential (primary) hypertension: I10

## 2017-10-10 ENCOUNTER — Other Ambulatory Visit (HOSPITAL_COMMUNITY): Payer: Self-pay | Admitting: Nurse Practitioner

## 2017-11-19 ENCOUNTER — Other Ambulatory Visit (HOSPITAL_COMMUNITY): Payer: Self-pay | Admitting: *Deleted

## 2017-11-19 MED ORDER — DILTIAZEM HCL ER COATED BEADS 120 MG PO CP24: 120.0000 mg | ORAL_CAPSULE | Freq: Every day | ORAL | 1 refills | Status: DC

## 2017-11-19 MED ORDER — DILTIAZEM HCL ER COATED BEADS 120 MG PO CP24: 120.0000 mg | ORAL_CAPSULE | Freq: Every day | ORAL | 0 refills | Status: DC

## 2018-03-15 ENCOUNTER — Encounter: Payer: Self-pay | Admitting: Cardiovascular Disease

## 2018-03-15 ENCOUNTER — Ambulatory Visit (INDEPENDENT_AMBULATORY_CARE_PROVIDER_SITE_OTHER): Payer: Medicare Other | Admitting: Cardiovascular Disease

## 2018-03-15 VITALS — BP 123/82 | HR 81 | Ht 59.0 in | Wt 179.8 lb

## 2018-03-15 DIAGNOSIS — I4819 Other persistent atrial fibrillation: Secondary | ICD-10-CM

## 2018-03-15 DIAGNOSIS — I481 Persistent atrial fibrillation: Secondary | ICD-10-CM | POA: Diagnosis not present

## 2018-03-15 DIAGNOSIS — I10 Essential (primary) hypertension: Secondary | ICD-10-CM | POA: Diagnosis not present

## 2018-03-15 NOTE — Patient Instructions (Signed)
Medication Instructions:  Your physician recommends that you continue on your current medications as directed. Please refer to the Current Medication list given to you today.  Labwork: NONE  Testing/Procedures: NONE  Follow-Up: Your physician wants you to follow-up in: 6 MONTHS WITH PA/NP You will receive a reminder letter in the mail two months in advance. If you don't receive a letter, please call our office to schedule the follow-up appointment.  Your physician wants you to follow-up in: 1 YEAR [facility]  You will receive a reminder letter in the mail two months in advance. If you don't receive a letter, please call our office to schedule the follow-up appointment.  If you need a refill on your cardiac medications before your next appointment, please call your pharmacy.

## 2018-03-15 NOTE — Progress Notes (Signed)
Cardiology Office Note   Date:  03/15/2018   ID:  Kayla Zuniga, DOB 1934-02-14, MRN [id number]  PCP:  Laurann MontanaWhite, Cynthia, MD  Cardiologist:   Chilton Siiffany , MD   No chief complaint on file.     History of Present Illness: Kayla Zuniga is an 84 y.o. female with paroxysmal atrial fibrillation, hypertension, and diabetes  here for follow-up.  Kayla Zuniga was initially seen 03/2017 in the atrial fibrillation clinic her PCP found her to be in atrial fibrillation.  She was completely asymptomatic.  She was started on Eliquis.  Thyroid function was normal.  Nebivolol was increased for rate control.  She had an echo 04/02/17 that revealed LVEF 60-65% and mild mitral regurgitation. She was started on lasix for LE edema.  She underwent DCCV 04/2017 but had no change in symptoms.  She had recurrent atrial fibrillation and volume overload after returning to afib.    Since her last appointment Kayla Zuniga has been doing well.  She is trying to exercise.  She gets up and walks when her commercials come on TV.  She has no exertional chest pain or shortness of breath.  She has no lower extremity edema, orthopnea or PND.  She has no palpitations, lightheadedness or dizziness.   She is doing well and has no complaints at this time.  With her diet and exercise changes she has been able to lose weight.  She never feels palpitations and has no lightheadedness or dizziness.   Past Medical History:  Diagnosis Date  . Diabetes mellitus without complication (HCC)   . Essential hypertension 09/19/2017  . Hypertension     Past Surgical History:  Procedure Laterality Date  . BUNIONECTOMY    . CARDIOVERSION N/A 04/13/2017   Procedure: CARDIOVERSION;  Surgeon: Chrystie NoseHilty, Kenneth C, MD;  [facility];  Service: Cardiovascular;  Laterality: N/A;  . CHOLECYSTECTOMY  1973  . DILATION AND CURETTAGE, DIAGNOSTIC / THERAPEUTIC  1980  . TONSILLECTOMY     age 6     Current Outpatient Medications    Medication Sig Dispense Refill  . atorvastatin (LIPITOR) 10 MG tablet 1 tablet daily.    Marland Kitchen. BYSTOLIC 5 MG tablet Take 2 tablets (10 mg total) by mouth daily. 30 tablet   . Cholecalciferol (VITAMIN D3) 3000 units TABS Take by mouth.    . diltiazem (CARDIZEM CD) 120 MG 24 hr capsule Take 1 capsule (120 mg total) by mouth daily. 90 capsule 1  . ELIQUIS 5 MG TABS tablet TAKE 1 TABLET BY MOUTH TWO  TIMES DAILY 180 tablet 2  . furosemide (LASIX) 20 MG tablet Take 1 tablet (20 mg total) by mouth daily. 30 tablet 2  . furosemide (LASIX) 20 MG tablet Take 1 tablet (20 mg total) by mouth daily. May take extra tablet as needed for weight gain. 110 tablet 2  . metFORMIN (GLUCOPHAGE-XR) 500 MG 24 hr tablet 4 tablets daily.    . Multiple Vitamins-Minerals (ICAPS AREDS 2 PO) Take by mouth.    . Multiple Vitamins-Minerals (MULTIVITAMIN WITH MINERALS) tablet Take 1 tablet by mouth daily.    . Omega-3 Fatty Acids (FISH OIL) 1000 MG CAPS Take by mouth.    . potassium chloride (K-DUR) 10 MEQ tablet Take 2 tablets (20 mEq total) by mouth daily. 180 tablet 3  . valsartan (DIOVAN) 320 MG tablet Take 1 tablet (320 mg total) by mouth daily. 30 tablet 2   No current facility-administered medications for this visit.  Allergies:   Penicillins; Sulfa antibiotics; and Prolia [denosumab]    Social History:  The patient  reports that she has never smoked. She has never used smokeless tobacco. She reports that she drinks alcohol.   Family History:  The patient's family history includes Heart disease in her maternal grandmother; Liver disease in her sister.    ROS:  Please see the history of present illness.   Otherwise, review of systems are positive for none.   All other systems are reviewed and negative.    PHYSICAL EXAM: VS:  BP 123/82   Pulse 81   Ht 4\' 11"  (1.499 m)   Wt 179 lb 12.8 oz (81.6 kg)   BMI 36.32 kg/m  , BMI Body mass index is 36.32 kg/m. GENERAL:  Well appearing HEENT: Pupils equal round  and reactive, fundi not visualized, oral mucosa unremarkable NECK:  No jugular venous distention, waveform within normal limits, carotid upstroke brisk and symmetric, no bruits, no thyromegaly LYMPHATICS:  No cervical adenopathy LUNGS:  Clear to auscultation bilaterally HEART: Irregularly irregular.  PMI not displaced or sustained,S1 and S2 within normal limits, no S3, no S4, no clicks, no rubs, no murmurs ABD:  Flat, positive bowel sounds normal in frequency in pitch, no bruits, no rebound, no guarding, no midline pulsatile mass, no hepatomegaly, no splenomegaly EXT:  2 plus pulses throughout, no edema, no cyanosis no clubbing SKIN:  No rashes no nodules NEURO:  Cranial nerves II through XII grossly intact, motor grossly intact throughout PSYCH:  Cognitively intact, oriented to person place and time   EKG:  EKG is ordered today. The ekg ordered 08/09/17 demonstrates atrial fibrillation.  Rate 94 bpm.  Non-specific ST-T changes.  03/15/18: Atrial fibrillation.  Rate 81 bpm.  Nonspecific ST-T changes.   Echo 04/02/17: Study Conclusions  - Left ventricle: The cavity size was normal. Wall thickness was   normal. Systolic function was normal. The estimated ejection   fraction was in the range of 60% to 65%. - Mitral valve: There was mild regurgitation. - Pulmonary arteries: PA peak pressure: 37 mm Hg (S).   Recent Labs: 04/03/2017: Magnesium 1.6 04/06/2017: Hemoglobin 13.1; Platelets 265; TSH 5.006 04/24/2017: BUN 16; Creatinine, Ser 1.04; Potassium 3.8; Sodium 138    Lipid Panel No results found for: CHOL, TRIG, HDL, CHOLHDL, VLDL, LDLCALC, LDLDIRECT    Wt Readings from Last 3 Encounters:  03/15/18 179 lb 12.8 oz (81.6 kg)  08/09/17 179 lb (81.2 kg)  05/09/17 181 lb 9.6 oz (82.4 kg)      ASSESSMENT AND PLAN:  # Persistent atrial fibrillation: Kayla Zuniga remains in atrial fibrillation.  She is doing well and her rate is well-controlled.  Continue diltiazem and nebivolol.  She  is on Eliquis for anticoagulation.   # Hypertension: Blood pressure well-controlled.  Continue nebivolol, diltiazem, Lasix, and losartan.    Current medicines are reviewed at length with the patient today.  The patient does not have concerns regarding medicines.  The following changes have been made:  no change  Labs/ tests ordered today include:  No orders of the defined types were placed in this encounter.    Disposition:   FU with Joevanni Roddey C. Duke Salvia, MD, [facility] in 6 months.    This note was written with the assistance of speech recognition software.  Please excuse any transcriptional errors.  Signed, Galvin Aversa C. Duke Salvia, MD, [facility]  03/15/2018 3:52 PM    [facility]

## 2018-06-07 ENCOUNTER — Other Ambulatory Visit (HOSPITAL_COMMUNITY): Payer: Self-pay | Admitting: Nurse Practitioner

## 2018-09-18 ENCOUNTER — Other Ambulatory Visit (HOSPITAL_COMMUNITY): Payer: Self-pay | Admitting: Nurse Practitioner

## 2018-09-18 NOTE — Telephone Encounter (Signed)
85 years old 81.6kg Last OV 03/15/18 Scr 1.13 on 09/18/17 (per [facility]) rx for 5mg  BID sent to pharmacy. Will need labs for next refill

## 2018-10-10 ENCOUNTER — Other Ambulatory Visit: Payer: Self-pay | Admitting: Cardiovascular Disease

## 2018-10-10 MED ORDER — APIXABAN 5 MG PO TABS: 5.0000 mg | ORAL_TABLET | Freq: Two times a day (BID) | ORAL | 0 refills | Status: DC

## 2018-10-10 NOTE — Telephone Encounter (Signed)
 *  STAT* If patient is at the pharmacy, call can be transferred to refill team.   1. Which medications need to be refilled? (please list name of each medication and dose if known) ELIQUIS 5 MG TABS tablet  2. Which pharmacy/location (including street and city if local pharmacy) is medication to be sent to? [facility]  3. Do they need a 30 day or 90 day supply? 90 day

## 2018-10-10 NOTE — Telephone Encounter (Signed)
Eliquis refilled.  

## 2018-10-25 ENCOUNTER — Other Ambulatory Visit (HOSPITAL_COMMUNITY): Payer: Self-pay | Admitting: Cardiovascular Disease

## 2018-10-28 NOTE — Telephone Encounter (Signed)
Diltiazem 120 mg refilled. 

## 2018-11-01 ENCOUNTER — Telehealth: Payer: Self-pay

## 2018-11-01 NOTE — Telephone Encounter (Signed)
Virtual Visit Pre-Appointment Phone Call  "Kayla Zuniga, I am calling you today to discuss your upcoming appointment. We are currently trying to limit exposure to the virus that causes COVID-19 by seeing patients at home rather than in the office."  1. "What is the BEST phone number to call the day of the visit?" - include this in appointment notes  2. "Do you have or have access to (through a family member/friend) a smartphone with video capability that we can use for your visit?" a. If yes - list this number in appt notes as "cell" (if different from BEST phone #) and list the appointment type as a VIDEO visit in appointment notes b. If no - list the appointment type as a PHONE visit in appointment notes  3. Confirm consent - "In the setting of the current Covid19 crisis, you are scheduled for a PHONE visit with your provider on 11/04/2018 at 10am.  Just as we do with many in-office visits, in order for you to participate in this visit, we must obtain consent.  If you'd like, I can send this to your mychart (if signed up) or email for you to review.  Otherwise, I can obtain your verbal consent now.  All virtual visits are billed to your insurance company just like a normal visit would be.  By agreeing to a virtual visit, we'd like you to understand that the technology does not allow for your provider to perform an examination, and thus may limit your provider's ability to fully assess your condition. If your provider identifies any concerns that need to be evaluated in person, we will make arrangements to do so.  Finally, though the technology is pretty good, we cannot assure that it will always work on either your or our end, and in the setting of a video visit, we may have to convert it to a phone-only visit.  In either situation, we cannot ensure that we have a secure connection.  Are you willing to proceed?" STAFF: Did the patient verbally acknowledge consent to telehealth visit? Document YES/NO  here: YES  4. Advise patient to be prepared - "Two hours prior to your appointment, go ahead and check your blood pressure, pulse, oxygen saturation, and your weight (if you have the equipment to check those) and write them all down. When your visit starts, your provider will ask you for this information. If you have an Apple Watch or Kardia device, please plan to have heart rate information ready on the day of your appointment. Please have a pen and paper handy nearby the day of the visit as well."  5. Give patient instructions for MyChart download to smartphone OR [email or web address] as below if video visit (depending on what platform provider is using)  6. Inform patient they will receive a phone call 15 minutes prior to their appointment time (may be from unknown caller ID) so they should be prepared to answer    TELEPHONE CALL NOTE  Kayla Zuniga has been deemed a candidate for a follow-up tele-health visit to limit community exposure during the Covid-19 pandemic. I spoke with the patient via phone to ensure availability of phone/video source, confirm preferred email & phone number, and discuss instructions and expectations.  I reminded Kayla Zuniga to be prepared with any vital sign and/or heart rhythm information that could potentially be obtained via home monitoring, at the time of her visit. I reminded Kayla Zuniga to expect a phone call prior to her  visit.  Lucita FerraraYoung, Gavyn Ybarra T, CMA 11/01/2018 3:52 PM   INSTRUCTIONS FOR DOWNLOADING THE MYCHART APP TO SMARTPHONE  - The patient must first make sure to have activated MyChart and know their login information - If Apple, go to [employer] Store and type in MyChart in the search bar and download the app. If Android, ask patient to go to [employer] Play Store and type in [facility] in the search bar and download the app. The app is free but as with any other app downloads, their phone may require them to verify saved payment information or Apple/Android  password.  - The patient will need to then log into the app with their MyChart username and password, and select [facility] as their healthcare provider to link the account. When it is time for your visit, go to the MyChart app, find appointments, and click Begin Video Visit. Be sure to Select Allow for your device to access the Microphone and Camera for your visit. You will then be connected, and your provider will be with you shortly.  **If they have any issues connecting, or need assistance please contact MyChart service desk [phone])**  **If using a computer, in order to ensure the best quality for their visit they will need to use either of the following Internet Browsers: [employer] Edge, or Google Chrome**  IF USING DOXIMITY or DOXY.ME - The patient will receive a link just prior to their visit by text.     FULL LENGTH CONSENT FOR TELE-HEALTH VISIT   I hereby voluntarily request, consent and authorize [facility] and its employed or contracted physicians, physician assistants, nurse practitioners or other licensed health care professionals (the Practitioner), to provide me with telemedicine health care services (the "Services") as deemed necessary by the treating Practitioner. I acknowledge and consent to receive the Services by the Practitioner via telemedicine. I understand that the telemedicine visit will involve communicating with the Practitioner through live audiovisual communication technology and the disclosure of certain medical information by electronic transmission. I acknowledge that I have been given the opportunity to request an in-person assessment or other available alternative prior to the telemedicine visit and am voluntarily participating in the telemedicine visit.  I understand that I have the right to withhold or withdraw my consent to the use of telemedicine in the course of my care at any time, without affecting my right to future care or treatment,  and that the Practitioner or I may terminate the telemedicine visit at any time. I understand that I have the right to inspect all information obtained and/or recorded in the course of the telemedicine visit and may receive copies of available information for a reasonable fee.  I understand that some of the potential risks of receiving the Services via telemedicine include:  Marland Kitchen. Delay or interruption in medical evaluation due to technological equipment failure or disruption; . Information transmitted may not be sufficient (e.g. poor resolution of images) to allow for appropriate medical decision making by the Practitioner; and/or  . In rare instances, security protocols could fail, causing a breach of personal health information.  Furthermore, I acknowledge that it is my responsibility to provide information about my medical history, conditions and care that is complete and accurate to the best of my ability. I acknowledge that Practitioner's advice, recommendations, and/or decision may be based on factors not within their control, such as incomplete or inaccurate data provided by me or distortions of diagnostic images or specimens that may result from electronic transmissions. I  understand that the practice of medicine is not an exact science and that Practitioner makes no warranties or guarantees regarding treatment outcomes. I acknowledge that I will receive a copy of this consent concurrently upon execution via email to the email address I last provided but may also request a printed copy by calling the office of [facility].    I understand that my insurance will be billed for this visit.   I have read or had this consent read to me. . I understand the contents of this consent, which adequately explains the benefits and risks of the Services being provided via telemedicine.  . I have been provided ample opportunity to ask questions regarding this consent and the Services and have had my questions  answered to my satisfaction. . I give my informed consent for the services to be provided through the use of telemedicine in my medical care  By participating in this telemedicine visit I agree to the above.

## 2018-11-01 NOTE — Telephone Encounter (Signed)
Left message for patient and on daughter phone to contact office to discuss upcoming appt.

## 2018-11-04 ENCOUNTER — Telehealth (INDEPENDENT_AMBULATORY_CARE_PROVIDER_SITE_OTHER): Payer: Medicare Other | Admitting: Cardiology

## 2018-11-04 ENCOUNTER — Telehealth: Payer: Self-pay

## 2018-11-04 ENCOUNTER — Encounter: Payer: Self-pay | Admitting: Cardiology

## 2018-11-04 VITALS — BP 118/84 | HR 79 | Temp 97.2°F | Ht 60.0 in | Wt 173.0 lb

## 2018-11-04 DIAGNOSIS — I4819 Other persistent atrial fibrillation: Secondary | ICD-10-CM

## 2018-11-04 DIAGNOSIS — N183 Chronic kidney disease, stage 3 unspecified: Secondary | ICD-10-CM | POA: Insufficient documentation

## 2018-11-04 DIAGNOSIS — I10 Essential (primary) hypertension: Secondary | ICD-10-CM

## 2018-11-04 DIAGNOSIS — E119 Type 2 diabetes mellitus without complications: Secondary | ICD-10-CM | POA: Insufficient documentation

## 2018-11-04 DIAGNOSIS — Z7901 Long term (current) use of anticoagulants: Secondary | ICD-10-CM | POA: Insufficient documentation

## 2018-11-04 NOTE — Progress Notes (Signed)
Virtual Visit via Telephone Note   This visit type was conducted due to national recommendations for restrictions regarding the COVID-19 Pandemic (e.g. social distancing) in an effort to limit this patient's exposure and mitigate transmission in our community.  Due to her co-morbid illnesses, this patient is at least at moderate risk for complications without adequate follow up.  This format is felt to be most appropriate for this patient at this time.  The patient did not have access to video technology/had technical difficulties with video requiring transitioning to audio format only (telephone).  All issues noted in this document were discussed and addressed.  No physical exam could be performed with this format.  Please refer to the patient's chart for her  consent to telehealth for [facility].   Date:  11/04/2018   ID:  Kayla Zuniga, DOB [birth year], MRN [id number]  Patient Location: Home Provider Location: Home  PCP:  Laurann Montana, MD  Cardiologist:  Chilton Si, MD  Electrophysiologist:  None   Evaluation Performed:  Follow-Up Visit  Chief Complaint:  none  History of Present Illness:    Kayla Zuniga is a 85 y.o. female with a history of persistent atrial fibrillation, hypertension, non-insulin-dependent diabetes, and chronic renal insufficiency stage III.  Echocardiogram in October 2018 showed normal LV function.  When she was diagnosed with atrial fibrillation in 2018 she was cardioverted but failed to hold.  She is essentially asymptomatic.  She was contacted today for six-month follow-up.  Since we saw her last she has been doing well, she says she has no limitations from her atrial fibrillation, she denies any unusual tachycardia or shortness of breath with exertion.  She is tolerating her medications well, she has had no bleeding or issues on Eliquis.  The patient does not have symptoms concerning for COVID-19 infection (fever, chills, cough, or new shortness of  breath).    Past Medical History:  Diagnosis Date  . Diabetes mellitus without complication (HCC)   . Essential hypertension 09/19/2017  . Hypertension    Past Surgical History:  Procedure Laterality Date  . BUNIONECTOMY    . CARDIOVERSION N/A 04/13/2017   Procedure: CARDIOVERSION;  Surgeon: Chrystie Nose, MD;  Location: [facility] ENDOSCOPY;  Service: Cardiovascular;  Laterality: N/A;  . CHOLECYSTECTOMY  1973  . DILATION AND CURETTAGE, DIAGNOSTIC / THERAPEUTIC  1980  . TONSILLECTOMY     age 49     Current Meds  Medication Sig  . apixaban (ELIQUIS) 5 MG TABS tablet Take 1 tablet (5 mg total) by mouth 2 (two) times daily.  Marland Kitchen atorvastatin (LIPITOR) 10 MG tablet 1 tablet daily.  . Cholecalciferol (VITAMIN D3) 3000 units TABS Take by mouth.  . Cyanocobalamin (B-12) 3000 MCG CAPS Take 1 capsule by mouth daily.  Marland Kitchen diltiazem (CARDIZEM CD) 120 MG 24 hr capsule TAKE 1 CAPSULE BY MOUTH  DAILY  . furosemide (LASIX) 20 MG tablet Take 1 tablet (20 mg total) by mouth daily.  . metFORMIN (GLUCOPHAGE-XR) 500 MG 24 hr tablet 4 tablets daily.  . Multiple Vitamins-Minerals (ICAPS AREDS 2 PO) Take 1 tablet by mouth 2 (two) times a day.   . Multiple Vitamins-Minerals (MULTIVITAMIN WITH MINERALS) tablet Take 1 tablet by mouth daily.  . nebivolol (BYSTOLIC) 10 MG tablet Take 10 mg by mouth daily.  . Omega-3 Fatty Acids (FISH OIL) 1000 MG CAPS Take by mouth.  . potassium chloride (K-DUR) 10 MEQ tablet Take 10 mEq by mouth daily.  . valsartan (DIOVAN) 320 MG tablet  Take 1 tablet (320 mg total) by mouth daily.     Allergies:   Penicillins; Sulfa antibiotics; and Prolia [denosumab]   Social History   Tobacco Use  . Smoking status: Never Smoker  . Smokeless tobacco: Never Used  Substance Use Topics  . Alcohol use: Yes    Comment: rarely  . Drug use: Not on file     Family Hx: The patient's family history includes Heart disease in her maternal grandmother; Liver disease in her sister.  ROS:    Please see the history of present illness.    All other systems reviewed and are negative.   Prior CV studies:   The following studies were reviewed today:  Labs/Other Tests and Data Reviewed:    EKG:  No ECG reviewed.  Recent Labs: No results found for requested labs within last 8760 hours.   Recent Lipid Panel No results found for: CHOL, TRIG, HDL, CHOLHDL, LDLCALC, LDLDIRECT  Wt Readings from Last 3 Encounters:  11/04/18 173 lb (78.5 kg)  03/15/18 179 lb 12.8 oz (81.6 kg)  08/09/17 179 lb (81.2 kg)     Objective:    Vital Signs:  BP 118/84   Pulse 79   Temp (!) 97.2 F (36.2 C)   Ht 5' (1.524 m)   Wt 173 lb (78.5 kg)   BMI 33.79 kg/m    VITAL SIGNS:  reviewed  ASSESSMENT & PLAN:    Persistent atrial fibrillation NL LVF, asymptomatic, rate controlled  Essential HTN- Controlled  NIDDM- On Glucophage  Anticoagulated- Eliquis 5 mg BID  CRI-3 GFR 49  COVID-19 Education: The signs and symptoms of COVID-19 were discussed with the patient and how to seek care for testing (follow up with PCP or arrange E-visit).  The importance of social distancing was discussed today.  Time:   Today, I have spent 10 minutes with the patient with telehealth technology discussing the above problems.     Medication Adjustments/Labs and Tests Ordered: Current medicines are reviewed at length with the patient today.  Concerns regarding medicines are outlined above.   Tests Ordered: No orders of the defined types were placed in this encounter.   Medication Changes: No orders of the defined types were placed in this encounter.   Disposition:  Follow up Dr Duke Salviaandolph in Oct   Signed, Zylon Creamer, [place]  11/04/2018 10:06 AM    [facility]

## 2018-11-04 NOTE — Patient Instructions (Signed)
Medication Instructions:  Your physician recommends that you continue on your current medications as directed. Please refer to the Current Medication list given to you today.  If you need a refill on your cardiac medications before your next appointment, please call your pharmacy.   Lab work: none If you have labs (blood work) drawn today and your tests are completely normal, you will receive your results only by: Marland Kitchen MyChart Message (if you have MyChart) OR . A paper copy in the mail If you have any lab test that is abnormal or we need to change your treatment, we will call you to review the results.  Testing/Procedures: None   Follow-Up: At [facility], you and your health needs are our priority.  As part of our continuing mission to provide you with exceptional heart care, we have created designated Provider Care Teams.  These Care Teams include your primary Cardiologist (physician) and Advanced Practice Providers (APPs -  Physician Assistants and Nurse Practitioners) who all work together to provide you with the care you need, when you need it. You will need a follow up appointment in 6 months.  Please call our office 2 months in advance to schedule this appointment.  You may see Chilton Si, MD or one of the following Advanced Practice Providers on your designated Care Team:   Corine Shelter, PA-C Judy Pimple, [place] . Marjie Skiff, PA-C  Any Other Special Instructions Will Be Listed Below (If Applicable).

## 2018-11-04 NOTE — Telephone Encounter (Signed)
Contacted patient to discuss AVS instructions. Patient voiced understanding. 

## 2018-12-11 ENCOUNTER — Other Ambulatory Visit: Payer: Self-pay | Admitting: Cardiovascular Disease

## 2018-12-11 NOTE — Telephone Encounter (Signed)
84 yrs, 81.6 kg, SCr 0.88 (10/2018) Last OV: 5/4 Indication: AF

## 2019-04-02 ENCOUNTER — Telehealth (INDEPENDENT_AMBULATORY_CARE_PROVIDER_SITE_OTHER): Payer: Medicare Other | Admitting: Cardiovascular Disease

## 2019-04-02 ENCOUNTER — Encounter: Payer: Self-pay | Admitting: Cardiovascular Disease

## 2019-04-02 DIAGNOSIS — Z7901 Long term (current) use of anticoagulants: Secondary | ICD-10-CM | POA: Diagnosis not present

## 2019-04-02 DIAGNOSIS — I4819 Other persistent atrial fibrillation: Secondary | ICD-10-CM

## 2019-04-02 DIAGNOSIS — I10 Essential (primary) hypertension: Secondary | ICD-10-CM | POA: Diagnosis not present

## 2019-04-02 NOTE — Patient Instructions (Signed)
Medication Instructions:  Your physician recommends that you continue on your current medications as directed. Please refer to the Current Medication list given to you today  If you need a refill on your cardiac medications before your next appointment, please call your pharmacy.   Lab work: None  Testing/Procedures: None   Follow-Up: At [facility], you and your health needs are our priority.  As part of our continuing mission to provide you with exceptional heart care, we have created designated Provider Care Teams.  These Care Teams include your primary Cardiologist (physician) and Advanced Practice Providers (APPs -  Physician Assistants and Nurse Practitioners) who all work together to provide you with the care you need, when you need it. You will need a follow up appointment in 12 months.  Please call our office 2 months in advance to schedule this appointment.  You may see Skeet Latch, MD or one of the following Advanced Practice Providers on your designated Care Team:   Kerin Ransom, PA-C Roby Lofts, [place] . Sande Rives, PA-C

## 2019-04-02 NOTE — Progress Notes (Signed)
Virtual Visit via Telephone Note   This visit type was conducted due to national recommendations for restrictions regarding the COVID-19 Pandemic (e.g. social distancing) in an effort to limit this patient's exposure and mitigate transmission in our community.  Due to her co-morbid illnesses, this patient is at least at moderate risk for complications without adequate follow up.  This format is felt to be most appropriate for this patient at this time.  The patient did not have access to video technology/had technical difficulties with video requiring transitioning to audio format only (telephone).  All issues noted in this document were discussed and addressed.  No physical exam could be performed with this format.  Please refer to the patient's chart for her  consent to telehealth for [facility].   Date:  04/02/2019   ID:  Kayla Zuniga, DOB [birth year], MRN [id number]  Patient Location: Home Provider Location: Office  PCP:  Laurann Montana, MD  Cardiologist:  Chilton Si, MD  Electrophysiologist:  None   Evaluation Performed:  Follow-Up Visit  Chief Complaint:  Follow up  History of Present Illness:    RENESHIA Zuniga is a 85 y.o. female with persistent atrial fibrillation, hypertension, and diabetes  here for follow-up.  [name] was initially seen 03/2017 in the atrial fibrillation clinic after her PCP found her to be in atrial fibrillation.  She was completely asymptomatic.  She was started on Eliquis.  Thyroid function was normal.  Nebivolol was increased for rate control.  She had an echo 04/02/17 that revealed LVEF 60-65% and mild mitral regurgitation. She was started on lasix for LE edema.  She underwent DCCV 04/2017 but had no change in symptoms.  She had recurrent atrial fibrillation and volume overload after returning to afib.   Since her last appointment [name] has been doing well.  She remains asymptomatic.  Lately she has not been as active due to the  coronavirus.  She has been staying at home and not going to the grocery store, which was her most common form of exercise.  Her family members have been bringing food to her.  She recently celebrated a birthday.  She is excited to go eat at the [facility], as she has not eaten out at [employer] since February.  She has not noted any chest pain or shortness of breath.  She denies any palpitations, lightheadedness, or dizziness.  She only has lower extremity edema if she has a salty meal and denies orthopnea or PND.  Overall she is doing well.   The patient does not have symptoms concerning for COVID-19 infection (fever, chills, cough, or new shortness of breath).    Past Medical History:  Diagnosis Date  . Diabetes mellitus without complication (HCC)   . Essential hypertension 09/19/2017  . Hypertension    Past Surgical History:  Procedure Laterality Date  . BUNIONECTOMY    . CARDIOVERSION N/A 04/13/2017   Procedure: CARDIOVERSION;  Surgeon: Chrystie Nose, MD;  Location: [facility] ENDOSCOPY;  Service: Cardiovascular;  Laterality: N/A;  . CHOLECYSTECTOMY  1973  . DILATION AND CURETTAGE, DIAGNOSTIC / THERAPEUTIC  1980  . TONSILLECTOMY     age 39     Current Meds  Medication Sig  . atorvastatin (LIPITOR) 10 MG tablet 1 tablet daily.  . Cholecalciferol (VITAMIN D3) 3000 units TABS Take by mouth.  . Cyanocobalamin (B-12) 3000 MCG CAPS Take 1 capsule by mouth daily.  Marland Kitchen diltiazem (CARDIZEM CD) 120 MG 24 hr capsule TAKE 1  CAPSULE BY MOUTH  DAILY  . ELIQUIS 5 MG TABS tablet TAKE 1 TABLET BY MOUTH  TWICE DAILY  . furosemide (LASIX) 20 MG tablet Take 1 tablet (20 mg total) by mouth daily.  . metFORMIN (GLUCOPHAGE-XR) 500 MG 24 hr tablet 4 tablets daily.  . Multiple Vitamins-Minerals (ICAPS AREDS 2 PO) Take 1 tablet by mouth 2 (two) times a day.   . Multiple Vitamins-Minerals (MULTIVITAMIN WITH MINERALS) tablet Take 1 tablet by mouth daily.  . nebivolol (BYSTOLIC) 10 MG tablet Take 10 mg by  mouth daily.  . Omega-3 Fatty Acids (FISH OIL) 1000 MG CAPS Take by mouth.  . valsartan (DIOVAN) 320 MG tablet Take 1 tablet (320 mg total) by mouth daily.     Allergies:   Penicillins, Sulfa antibiotics, and Prolia [denosumab]   Social History   Tobacco Use  . Smoking status: Never Smoker  . Smokeless tobacco: Never Used  Substance Use Topics  . Alcohol use: Yes    Comment: rarely  . Drug use: Not on file     Family Hx: The patient's family history includes Heart disease in her maternal grandmother; Liver disease in her sister.  ROS:   Please see the history of present illness.     All other systems reviewed and are negative.   Prior CV studies:   The following studies were reviewed today:  Echo 04/02/17: Study Conclusions  - Left ventricle: The cavity size was normal. Wall thickness was normal. Systolic function was normal. The estimated ejection fraction was in the range of 60% to 65%. - Mitral valve: There was mild regurgitation. - Pulmonary arteries: PA peak pressure: 37 mm Hg (S).   Labs/Other Tests and Data Reviewed:    EKG:  No ECG reviewed.  Recent Labs: No results found for requested labs within last 8760 hours.   Recent Lipid Panel No results found for: CHOL, TRIG, HDL, CHOLHDL, LDLCALC, LDLDIRECT  Wt Readings from Last 3 Encounters:  04/02/19 177 lb (80.3 kg)  11/04/18 173 lb (78.5 kg)  03/15/18 179 lb 12.8 oz (81.6 kg)     Objective:    Vital Signs:  BP 123/85   Pulse 80   Temp (!) 97.3 F (36.3 C)   Ht 5' (1.524 m)   Wt 177 lb (80.3 kg)   BMI 34.57 kg/m    VITAL SIGNS:  reviewed GEN:  no acute distress RESPIRATORY:  breathing non-labored NEURO:  alert and oriented x 3, no obvious focal deficit PSYCH:  normal affect  ASSESSMENT & PLAN:    # Persistent atrial fibrillation: Ms. Cordial remains in atrial fibrillation. She remains asymptomatic and rates have been controlled.  Continue diltiazem, nebivolol and Eliquis.    #  Hypertension: Blood pressure well-controlled.  Continue nebivolol, diltiazem, Lasix, and losartan.  She will have a BMP repeated with her PCP as potassium supplementation was discontinued.   COVID-19 Education: The signs and symptoms of COVID-19 were discussed with the patient and how to seek care for testing (follow up with PCP or arrange E-visit).  The importance of social distancing was discussed today.  Time:   Today, I have spent  minutes with the patient with telehealth technology discussing the above problems.     Medication Adjustments/Labs and Tests Ordered: Current medicines are reviewed at length with the patient today.  Concerns regarding medicines are outlined above.   Tests Ordered: No orders of the defined types were placed in this encounter.   Medication Changes: No orders of the defined  types were placed in this encounter.   Follow Up:  Virtual Visit or In Person in 1 year(s)  Signed, Chilton Siiffany Emajagua, MD  04/02/2019 9:27 AM    [facility]

## 2019-04-22 ENCOUNTER — Other Ambulatory Visit (HOSPITAL_COMMUNITY): Payer: Self-pay | Admitting: Cardiovascular Disease

## 2019-05-21 ENCOUNTER — Other Ambulatory Visit: Payer: Self-pay | Admitting: Cardiovascular Disease

## 2019-07-22 ENCOUNTER — Ambulatory Visit: Payer: Medicare Other | Attending: Internal Medicine

## 2019-07-22 DIAGNOSIS — Z23 Encounter for immunization: Secondary | ICD-10-CM

## 2019-07-22 NOTE — Progress Notes (Signed)
   Covid-19 Vaccination Clinic  Name:  Kayla Zuniga    MRN: [id number] DOB: 16-Oct-1933  07/22/2019  Kayla Zuniga was observed post Covid-19 immunization for 15 minutes without incidence. She was provided with Vaccine Information Sheet and instruction to access the V-Safe system.   Kayla Zuniga was instructed to call 911 with any severe reactions post vaccine: Marland Kitchen Difficulty breathing  . Swelling of your face and throat  . A fast heartbeat  . A bad rash all over your body  . Dizziness and weakness    Immunizations Administered    Name Date Dose VIS Date Route   Pfizer COVID-19 Vaccine 07/22/2019 12:02 PM 0.3 mL 06/13/2019 Intramuscular   Manufacturer: [employer], [employer]   Lot: [id number]   NDC: [address]-2

## 2019-08-12 ENCOUNTER — Ambulatory Visit: Payer: Medicare Other | Attending: Internal Medicine

## 2019-08-12 DIAGNOSIS — Z23 Encounter for immunization: Secondary | ICD-10-CM | POA: Insufficient documentation

## 2019-08-12 NOTE — Progress Notes (Signed)
   Covid-19 Vaccination Clinic  Name:  CARYNN FELLING    MRN: [id number] DOB: 10-05-1933  08/12/2019  Ms. Bettes was observed post Covid-19 immunization for 15 minutes without incidence. She was provided with Vaccine Information Sheet and instruction to access the V-Safe system.   Ms. Seeber was instructed to call 911 with any severe reactions post vaccine: Marland Kitchen Difficulty breathing  . Swelling of your face and throat  . A fast heartbeat  . A bad rash all over your body  . Dizziness and weakness    Immunizations Administered    Name Date Dose VIS Date Route   Pfizer COVID-19 Vaccine 08/12/2019 12:33 PM 0.3 mL 06/13/2019 Intramuscular   Manufacturer: [employer], [employer]   Lot: [id number]   NDC: [address]-5

## 2019-10-02 DIAGNOSIS — E039 Hypothyroidism, unspecified: Secondary | ICD-10-CM

## 2019-10-02 HISTORY — DX: Hypothyroidism, unspecified: E03.9

## 2020-04-01 ENCOUNTER — Ambulatory Visit: Payer: Medicare Other | Admitting: Cardiovascular Disease

## 2020-05-11 ENCOUNTER — Encounter: Payer: Self-pay | Admitting: Cardiovascular Disease

## 2020-05-11 ENCOUNTER — Other Ambulatory Visit: Payer: Self-pay

## 2020-05-11 ENCOUNTER — Ambulatory Visit (INDEPENDENT_AMBULATORY_CARE_PROVIDER_SITE_OTHER): Payer: Medicare Other | Admitting: Cardiovascular Disease

## 2020-05-11 VITALS — BP 125/72 | HR 114 | Temp 96.1°F | Ht 60.0 in | Wt 188.6 lb

## 2020-05-11 DIAGNOSIS — I4819 Other persistent atrial fibrillation: Secondary | ICD-10-CM

## 2020-05-11 DIAGNOSIS — I10 Essential (primary) hypertension: Secondary | ICD-10-CM | POA: Diagnosis not present

## 2020-05-11 DIAGNOSIS — Z7901 Long term (current) use of anticoagulants: Secondary | ICD-10-CM | POA: Diagnosis not present

## 2020-05-11 MED ORDER — DILTIAZEM HCL ER COATED BEADS 180 MG PO CP24: 180.0000 mg | ORAL_CAPSULE | Freq: Every day | ORAL | 3 refills | Status: DC

## 2020-05-11 NOTE — Progress Notes (Signed)
Cardiology Office Note   Date:  05/11/2020   ID:  Kayla Zuniga, DOB September 02, 1933, MRN [id number]  PCP:  Laurann Montana, MD  Cardiologist:  Chilton Si, MD  Electrophysiologist:  None   Evaluation Performed:  Follow-Up Visit  Chief Complaint:  Follow up  History of Present Illness:    Kayla Zuniga is a 86 y.o. female with persistent atrial fibrillation, hypertension, and diabetes here for follow-up.  [name] was initially seen 03/2017 in the atrial fibrillation clinic after her PCP found her to be in atrial fibrillation.  She was completely asymptomatic.  She was started on Eliquis.  Thyroid function was normal.  Nebivolol was increased for rate control.  She had an echo 04/02/17 that revealed LVEF 60-65% and mild mitral regurgitation. She was started on lasix for LE edema.  She underwent DCCV 04/2017 but had no change in symptoms.  She had recurrent atrial fibrillation and volume overload after returning to afib.    She was last seen virtually 03/2019 and was doing well.  [name] has been doing well physically.  This week she had two friends pass away suddenly.  She has been trying to exercise and continues to do her housework.  She lays on the bed and does leg exercises.  She does her ADLs.  She denies chest pain or shortness of breath.  Her BP has been well-controlled.  She isn't sure what her heart rate has been.  In the last couple months she has felt it skipping around some.  She denies any lightheadedness or dizziness.   Past Medical History:  Diagnosis Date  . Diabetes mellitus without complication (HCC)   . Essential hypertension 09/19/2017  . Hypertension    Past Surgical History:  Procedure Laterality Date  . BUNIONECTOMY    . CARDIOVERSION N/A 04/13/2017   Procedure: CARDIOVERSION;  Surgeon: Chrystie Nose, MD;  Location: [facility] ENDOSCOPY;  Service: Cardiovascular;  Laterality: N/A;  . CHOLECYSTECTOMY  1973  . DILATION AND CURETTAGE, DIAGNOSTIC / THERAPEUTIC   1980  . TONSILLECTOMY     age 79     Current Meds  Medication Sig  . atorvastatin (LIPITOR) 10 MG tablet 1 tablet daily.  . Cholecalciferol (VITAMIN D3) 3000 units TABS Take by mouth.  . Cyanocobalamin (B-12) 3000 MCG CAPS Take 1 capsule by mouth daily.  Marland Kitchen diltiazem (CARDIZEM CD) 180 MG 24 hr capsule Take 1 capsule (180 mg total) by mouth daily.  Marland Kitchen ELIQUIS 5 MG TABS tablet TAKE 1 TABLET BY MOUTH  TWICE DAILY  . furosemide (LASIX) 20 MG tablet Take 1 tablet (20 mg total) by mouth daily.  . metFORMIN (GLUCOPHAGE-XR) 500 MG 24 hr tablet 2 tablets daily.   . Multiple Vitamins-Minerals (ICAPS AREDS 2 PO) Take 1 tablet by mouth 2 (two) times a day.   . Multiple Vitamins-Minerals (MULTIVITAMIN WITH MINERALS) tablet Take 1 tablet by mouth daily.  . nebivolol (BYSTOLIC) 10 MG tablet Take 10 mg by mouth daily.  . Omega-3 Fatty Acids (FISH OIL) 1000 MG CAPS Take by mouth.  . valsartan (DIOVAN) 320 MG tablet Take 1 tablet (320 mg total) by mouth daily.  . [DISCONTINUED] diltiazem (CARDIZEM CD) 120 MG 24 hr capsule TAKE 1 CAPSULE BY MOUTH  DAILY     Allergies:   Penicillins, Sulfa antibiotics, and Prolia [denosumab]   Social History   Tobacco Use  . Smoking status: Never Smoker  . Smokeless tobacco: Never Used  Substance Use Topics  . Alcohol use: Yes  Comment: rarely  . Drug use: Not on file     Family Hx: The patient's family history includes Heart disease in her maternal grandmother; Liver disease in her sister.  ROS:   Please see the history of present illness.     All other systems reviewed and are negative.   Prior CV studies:   The following studies were reviewed today:  Echo 04/02/17: Study Conclusions  - Left ventricle: The cavity size was normal. Wall thickness was normal. Systolic function was normal. The estimated ejection fraction was in the range of 60% to 65%. - Mitral valve: There was mild regurgitation. - Pulmonary arteries: PA peak pressure: 37 mm Hg  (S).   Labs/Other Tests and Data Reviewed:    EKG:  An ECG dated 05/11/20 was personally reviewed today and demonstrated:  atrial fibrillaiton.  Rate 114 bpm.  Nonspecific ST changes  Recent Labs: No results found for requested labs within last 8760 hours.   Recent Lipid Panel No results found for: CHOL, TRIG, HDL, CHOLHDL, LDLCALC, LDLDIRECT  Wt Readings from Last 3 Encounters:  05/11/20 188 lb 9.6 oz (85.5 kg)  04/02/19 177 lb (80.3 kg)  11/04/18 173 lb (78.5 kg)        VS:  BP 125/72   Pulse (!) 114   Temp (!) 96.1 F (35.6 C)   Ht 5' (1.524 m)   Wt 188 lb 9.6 oz (85.5 kg)   SpO2 96%   BMI 36.83 kg/m  , BMI Body mass index is 36.83 kg/m. GENERAL:  Well appearing HEENT: Pupils equal round and reactive, fundi not visualized, oral mucosa unremarkable NECK:  No jugular venous distention, waveform within normal limits, carotid upstroke brisk and symmetric, no bruits LUNGS:  Clear to auscultation bilaterally HEART: Tachycardic.  Irregularly irregular.  PMI not displaced or sustained,S1 and S2 within normal limits, no S3, no S4, no clicks, no rubs, no murmurs ABD:  Flat, positive bowel sounds normal in frequency in pitch, no bruits, no rebound, no guarding, no midline pulsatile mass, no hepatomegaly, no splenomegaly EXT:  2 plus pulses throughout, no edema, no cyanosis no clubbing SKIN:  No rashes no nodules NEURO:  Cranial nerves II through XII grossly intact, motor grossly intact throughout PSYCH:  Cognitively intact, oriented to person place and time   ASSESSMENT & PLAN:    # Persistent atrial fibrillation: [name] remains in atrial fibrillation. Rates are poorly controlled.  We will increase diltiazem to 180mg  daily.  Continue nebivolol and Eliquis.  She will track her BP and heart rate at home.     # Hypertension:  Blood pressure well-controlled.  Continue nebivolol, diltiazem, Lasix, and losartan.  Increase diltiazem to 180mg  as above.     Time:   Today, I  have spent 25 minutes with the patient with telehealth technology discussing the above problems.     Medication Adjustments/Labs and Tests Ordered: Current medicines are reviewed at length with the patient today.  Concerns regarding medicines are outlined above.   Tests Ordered: Orders Placed This Encounter  Procedures  . EKG 12-Lead    Medication Changes: Meds ordered this encounter  Medications  . diltiazem (CARDIZEM CD) 180 MG 24 hr capsule    Sig: Take 1 capsule (180 mg total) by mouth daily.    Dispense:  90 capsule    Refill:  3    NEW DOSE, D/C 120 MG RX    Follow Up:  With Laura Caldas C. , MD, [facility] in 2 months  Signed, Chilton Si, MD  05/11/2020 10:28 AM    [facility]

## 2020-05-11 NOTE — Patient Instructions (Signed)
Medication Instructions:  INCREASE YOUR DILTIAZEM TO 180 MG DAILY   *If you need a refill on your cardiac medications before your next appointment, please call your pharmacy*  Lab Work: NONE  Testing/Procedures: NONE  Follow-Up: At [facility], you and your health needs are our priority.  As part of our continuing mission to provide you with exceptional heart care, we have created designated Provider Care Teams.  These Care Teams include your primary Cardiologist (physician) and Advanced Practice Providers (APPs -  Physician Assistants and Nurse Practitioners) who all work together to provide you with the care you need, when you need it.  We recommend signing up for the patient portal called "MyChart".  Sign up information is provided on this After Visit Summary.  MyChart is used to connect with patients for Virtual Visits (Telemedicine).  Patients are able to view lab/test results, encounter notes, upcoming appointments, etc.  Non-urgent messages can be sent to your provider as well.   To learn more about what you can do with MyChart, go to [email or web address].    Your next appointment:   2 month(s)  The format for your next appointment:   In Person  Provider:   You may see Chilton Si, MD or one of the following Advanced Practice Providers on your designated Care Team:    Corine Shelter, PA-C  [place], [place]  Edd Fabian, [place]

## 2020-07-13 NOTE — Progress Notes (Signed)
Cardiology Clinic Note   Patient Name: Kayla Zuniga Date of Encounter: 07/15/2020  Primary Care Provider:  Laurann Montana, MD Primary Cardiologist:  Chilton Si, MD  Patient Profile    Kayla Zuniga 87 year old female presents to the clinic today for follow-up evaluation of her atrial fibrillation and hypertension.  Past Medical History    Past Medical History:  Diagnosis Date  . Diabetes mellitus without complication (HCC)   . Essential hypertension 09/19/2017  . Hypertension    Past Surgical History:  Procedure Laterality Date  . BUNIONECTOMY    . CARDIOVERSION N/A 04/13/2017   Procedure: CARDIOVERSION;  Surgeon: Chrystie Nose, MD;  Location: [facility] ENDOSCOPY;  Service: Cardiovascular;  Laterality: N/A;  . CHOLECYSTECTOMY  1973  . DILATION AND CURETTAGE, DIAGNOSTIC / THERAPEUTIC  1980  . TONSILLECTOMY     age 66    Allergies  Allergies  Allergen Reactions  . Penicillins Hives and Itching  . Sulfa Antibiotics Itching  . Amlodipine Besylate Other (See Comments)  . Atenolol Other (See Comments)  . Prolia [Denosumab]     Hallucinations/pill form only     History of Present Illness    Kayla Zuniga has a PMH persistent atrial fibrillation, HTN, diabetes, and history of DCCV.  She was initially seen 9/18 with atrial fibrillation after it was found by her PCP.  She was completely asymptomatic at that time.  She was started on Eliquis.  Her thyroid function was normal.  Nebivolol was used for rate control.  Her echocardiogram 10/18 showed an LVEF of 60-65% with mild mitral valve regurgitation.  She was also started on furosemide for lower extremity edema.  She underwent DCCV 10/18 but had no change in her symptoms.  She was noted to have recurrent atrial fibrillation and was feeling volume overloaded after returning to atrial fibrillation.  On 9/20 she was seen in follow-up virtually and was doing well.  She was last seen by Dr. Duke Salvia on 05/11/2020.  During that  time she reported she was doing well.  She reported that she had suddenly had 2 friends passed away suddenly that week.  She been trying to exercise and continues to do her housework.  She continues to her ADLs.  She denies chest pain and shortness of breath.  Her blood pressure was well controlled.  She was cardiac on fire at that time.  She did report over the last few months she had felt her heart skipping at times.  She denied lightheadedness and dizziness.  Her diltiazem was increased to 180 mg daily, her nebivolol and Eliquis were continued.  She was instructed to monitor her blood pressure and heart rate at home.  She presents to the clinic today for follow-up evaluation states she feels well.  She has continue to monitor her blood pressure at home and reports that it has been running in the 130s over 80s.  She had 1 fall 11/21 and does not know why she fell.  She states she did not trip did not feel dizzy.  She fell backward did not hit her head at the time.  She continues to be very physically active walking, helping her neighbors, and doing all of her own housework.  I will give her the salty 6 diet sheet, increase her physical activity as tolerated, and have her follow-up in 6 months.  Today she denies chest pain, shortness of breath, lower extremity edema, fatigue, palpitations, melena, hematuria, hemoptysis, diaphoresis, weakness, presyncope, syncope, orthopnea, and PND.  Home Medications    Prior to Admission medications   Medication Sig Start Date End Date Taking? Authorizing Provider  atorvastatin (LIPITOR) 10 MG tablet 1 tablet daily. 02/20/17   [provider]  Cholecalciferol (VITAMIN D3) 3000 units TABS Take by mouth.    [provider]  Cyanocobalamin (B-12) 3000 MCG CAPS Take 1 capsule by mouth daily.    [provider]  diltiazem (CARDIZEM CD) 180 MG 24 hr capsule Take 1 capsule (180 mg total) by mouth daily. 05/11/20   Chilton Si, MD  ELIQUIS 5  MG TABS tablet TAKE 1 TABLET BY MOUTH  TWICE DAILY 05/21/19   Chilton Si, MD  furosemide (LASIX) 20 MG tablet Take 1 tablet (20 mg total) by mouth daily. 04/24/17   Newman Nip, NP  metFORMIN (GLUCOPHAGE-XR) 500 MG 24 hr tablet 2 tablets daily.  02/20/17   [provider]  Multiple Vitamins-Minerals (ICAPS AREDS 2 PO) Take 1 tablet by mouth 2 (two) times a day.     [provider]  Multiple Vitamins-Minerals (MULTIVITAMIN WITH MINERALS) tablet Take 1 tablet by mouth daily.    [provider]  nebivolol (BYSTOLIC) 10 MG tablet Take 10 mg by mouth daily.    [provider]  Omega-3 Fatty Acids (FISH OIL) 1000 MG CAPS Take by mouth.    [provider]  valsartan (DIOVAN) 320 MG tablet Take 1 tablet (320 mg total) by mouth daily. 04/03/17   Newman Nip, NP    Family History    Family History  Problem Relation Age of Onset  . Liver disease Sister   . Heart disease Maternal Grandmother    She indicated that her mother is deceased. She indicated that her father is deceased. She indicated that both of her sisters are alive. She indicated that her maternal grandmother is deceased. She indicated that her maternal grandfather is deceased. She indicated that her paternal grandmother is deceased. She indicated that her paternal grandfather is deceased. She indicated that both of her daughters are alive. She indicated that only one of her two sons is alive.  Social History    Social History   Socioeconomic History  . Marital status: Single    Spouse name: Not on file  . Number of children: Not on file  . Years of education: Not on file  . Highest education level: Not on file  Occupational History  . Not on file  Tobacco Use  . Smoking status: Never Smoker  . Smokeless tobacco: Never Used  Substance and Sexual Activity  . Alcohol use: Yes    Comment: rarely  . Drug use: Not on file  . Sexual activity: Not on file  Other Topics  Concern  . Not on file  Social History Narrative  . Not on file   Social Determinants of Health   Financial Resource Strain: Not on file  Food Insecurity: Not on file  Transportation Needs: Not on file  Physical Activity: Not on file  Stress: Not on file  Social Connections: Not on file  Intimate Partner Violence: Not on file     Review of Systems    General:  No chills, fever, night sweats or weight changes.  Cardiovascular:  No chest pain, dyspnea on exertion, edema, orthopnea, palpitations, paroxysmal nocturnal dyspnea. Dermatological: No rash, lesions/masses Respiratory: No cough, dyspnea Urologic: No hematuria, dysuria Abdominal:   No nausea, vomiting, diarrhea, bright red blood per rectum, melena, or hematemesis Neurologic:  No visual changes, wkns, changes  in mental status. All other systems reviewed and are otherwise negative except as noted above.  Physical Exam    VS:  BP 132/82   Pulse 90   Ht 5' (1.524 m)   Wt 188 lb 6.4 oz (85.5 kg)   SpO2 96%   BMI 36.79 kg/m  , BMI Body mass index is 36.79 kg/m. GEN: Well nourished, well developed, in no acute distress. HEENT: normal. Neck: Supple, no JVD, carotid bruits, or masses. Cardiac: RRR, no murmurs, rubs, or gallops. No clubbing, cyanosis, edema.  Radials/DP/PT 2+ and equal bilaterally.  Respiratory:  Respirations regular and unlabored, clear to auscultation bilaterally. GI: Soft, nontender, nondistended, BS + x 4. MS: no deformity or atrophy. Skin: warm and dry, no rash. Neuro:  Strength and sensation are intact. Psych: Normal affect.  Accessory Clinical Findings    Recent Labs: No results found for requested labs within last 8760 hours.   Recent Lipid Panel No results found for: CHOL, TRIG, HDL, CHOLHDL, VLDL, LDLCALC, LDLDIRECT  ECG personally reviewed by me today-none today.  Echocardiogram 04/02/2017  Study Conclusions   - Left ventricle: The cavity size was normal. Wall thickness was   normal. Systolic function was normal. The estimated ejection  fraction was in the range of 60% to 65%.  - Mitral valve: There was mild regurgitation.  - Pulmonary arteries: PA peak pressure: 37 mm Hg (S).  Assessment & Plan   1.  Persistent atrial fibrillation- heart rate today 90.  Remains cardiac unaware.  Rate control much better with increased diltiazem.  Has not noticed any recent episodes of dizziness, presyncope or increased heart rate. Continue diltiazem, furosemide, nebivolol, Eliquis Heart healthy low-sodium diet-salty 6 given Increase physical activity as tolerated Avoid triggers caffeine, chocolate, EtOH, dehydration etc. Continue to monitor pulse  Essential hypertension-BP today 132/82.  Well-controlled at home Continue losartan, diltiazem, nebivolol, furosemide Heart healthy low-sodium diet-salty 6 given Increase physical activity as tolerated Continue blood pressure log  Disposition: Follow-up with Dr. Duke Salvia or me in 6 months.  Thomasene Ripple. Collene Massimino NP-C    07/15/2020, 3:10 PM [facility] [address] Office [phone] Fax [phone]  Notice: This dictation was prepared with Dragon dictation along with smaller phrase technology. Any transcriptional errors that result from this process are unintentional and may not be corrected upon review.  I spent 15 minutes examining this patient, reviewing medications, and using patient centered shared decision making involving her cardiac care.  Prior to her visit I spent greater than 20 minutes reviewing her past medical history,  medications, and prior cardiac tests.

## 2020-07-15 ENCOUNTER — Other Ambulatory Visit: Payer: Self-pay

## 2020-07-15 ENCOUNTER — Encounter: Payer: Self-pay | Admitting: General Practice

## 2020-07-15 ENCOUNTER — Ambulatory Visit (INDEPENDENT_AMBULATORY_CARE_PROVIDER_SITE_OTHER): Payer: Medicare Other | Admitting: General Practice

## 2020-07-15 VITALS — BP 132/82 | HR 90 | Ht 60.0 in | Wt 188.4 lb

## 2020-07-15 DIAGNOSIS — I4819 Other persistent atrial fibrillation: Secondary | ICD-10-CM | POA: Diagnosis not present

## 2020-07-15 DIAGNOSIS — I10 Essential (primary) hypertension: Secondary | ICD-10-CM

## 2020-07-15 NOTE — Patient Instructions (Signed)
Medication Instructions:  The current medical regimen is effective;  continue present plan and medications as directed. Please refer to the Current Medication list given to you today.   *If you need a refill on your cardiac medications before your next appointment, please call your pharmacy*  Lab Work:   Testing/Procedures:  NONE    NONE  Special Instructions TAKE AND LOG YOUR BLOOD PRESSURE AND PULSE(HEART RATE) 2 TIME A WEEK AT LEAST AN HOUR AFTER TAKING YOUR  MEDICATION.  PLEASE CALL IF YOUR HEART RATE >100  PLEASE READ AND FOLLOW SALTY 6-ATTACHED-1,800mg  daily  Follow-Up: Your next appointment:  6 month(s) In Person with Chilton Si, MD OR IF UNAVAILABLE JESSE CLEAVER, FNP-C   Please call our office 2 months in advance to schedule this appointment   At [facility], you and your health needs are our priority.  As part of our continuing mission to provide you with exceptional heart care, we have created designated Provider Care Teams.  These Care Teams include your primary Cardiologist (physician) and Advanced Practice Providers (APPs -  Physician Assistants and Nurse Practitioners) who all work together to provide you with the care you need, when you need it.  We recommend signing up for the patient portal called "MyChart".  Sign up information is provided on this After Visit Summary.  MyChart is used to connect with patients for Virtual Visits (Telemedicine).  Patients are able to view lab/test results, encounter notes, upcoming appointments, etc.  Non-urgent messages can be sent to your provider as well.   To learn more about what you can do with MyChart, go to [email or web address].              6 SALTY THINGS TO AVOID     1,800MG  DAILY

## 2020-07-17 ENCOUNTER — Other Ambulatory Visit: Payer: Self-pay | Admitting: Cardiovascular Disease

## 2020-07-19 NOTE — Telephone Encounter (Signed)
86 F 85.5 kg SCr 1.04 LOV Cleaver 1/22

## 2020-11-17 ENCOUNTER — Telehealth: Payer: Self-pay | Admitting: Cardiovascular Disease

## 2020-11-17 MED ORDER — ELIQUIS 5 MG PO TABS: 1.0000 | ORAL_TABLET | Freq: Two times a day (BID) | ORAL | 1 refills | Status: DC

## 2020-11-17 NOTE — Telephone Encounter (Signed)
39f, 85.5kg, Creatinine, Serum 1.080 mg/ 11/03/2020, lovw/cleaver 07/15/20

## 2020-11-17 NOTE — Telephone Encounter (Signed)
*  STAT* If patient is at the pharmacy, call can be transferred to refill team.   1. Which medications need to be refilled? (please list name of each medication and dose if known) ELIQUIS 5 MG TABS tablet  2. Which pharmacy/location (including street and city if local pharmacy) is medication to be sent to? [facility] - [place], [place] - [address], [address]  3. Do they need a 30 day or 90 day supply? 90 day supply   The patient is all out of this medication

## 2021-02-24 ENCOUNTER — Ambulatory Visit (INDEPENDENT_AMBULATORY_CARE_PROVIDER_SITE_OTHER): Payer: Medicare Other | Admitting: Cardiovascular Disease

## 2021-02-24 ENCOUNTER — Encounter (HOSPITAL_BASED_OUTPATIENT_CLINIC_OR_DEPARTMENT_OTHER): Payer: Self-pay | Admitting: Cardiovascular Disease

## 2021-02-24 ENCOUNTER — Other Ambulatory Visit: Payer: Self-pay

## 2021-02-24 DIAGNOSIS — I10 Essential (primary) hypertension: Secondary | ICD-10-CM

## 2021-02-24 DIAGNOSIS — I4819 Other persistent atrial fibrillation: Secondary | ICD-10-CM | POA: Diagnosis not present

## 2021-02-24 DIAGNOSIS — N183 Chronic kidney disease, stage 3 unspecified: Secondary | ICD-10-CM | POA: Diagnosis not present

## 2021-02-24 MED ORDER — DILTIAZEM HCL ER COATED BEADS 240 MG PO CP24: 240.0000 mg | ORAL_CAPSULE | Freq: Every day | ORAL | 3 refills | Status: DC

## 2021-02-24 NOTE — Patient Instructions (Signed)
Medication Instructions:  INCREASE YOUR DILTIAZEM TO 240 MG DAILY   *If you need a refill on your cardiac medications before your next appointment, please call your pharmacy*  Lab Work: NONE  Testing/Procedures: NONE  Follow-Up: At [facility], you and your health needs are our priority.  As part of our continuing mission to provide you with exceptional heart care, we have created designated Provider Care Teams.  These Care Teams include your primary Cardiologist (physician) and Advanced Practice Providers (APPs -  Physician Assistants and Nurse Practitioners) who all work together to provide you with the care you need, when you need it.  We recommend signing up for the patient portal called "MyChart".  Sign up information is provided on this After Visit Summary.  MyChart is used to connect with patients for Virtual Visits (Telemedicine).  Patients are able to view lab/test results, encounter notes, upcoming appointments, etc.  Non-urgent messages can be sent to your provider as well.   To learn more about what you can do with MyChart, go to [email or web address].    Your next appointment:   12 month(s)  The format for your next appointment:   In Person  Provider:   Chilton Si, MD  Your physician recommends that you schedule a follow-up appointment in: 6 MONTHS WITH Ronn Melena NP

## 2021-02-24 NOTE — Assessment & Plan Note (Signed)
Rates are poorly controlled.  We will increase her diltiazem to 240 mg daily.  She will continue Eliquis and nebivolol.

## 2021-02-24 NOTE — Progress Notes (Signed)
Cardiology Office Note   Date:  02/24/2021   ID:  Kayla, Zuniga 1934-03-07, MRN [id number]  PCP:  Laurann Montana, MD  Cardiologist:  Chilton Si, MD  Electrophysiologist:  None   Evaluation Performed:  Follow-Up Visit  Chief Complaint:  Follow up  History of Present Illness:    Kayla Zuniga is a 87 y.o. female with persistent atrial fibrillation, hypertension, and diabetes here for follow-up.  Ms. Kayla Zuniga was initially seen 03/2017 in the atrial fibrillation clinic after her PCP found her to be in atrial fibrillation.  She was completely asymptomatic.  She was started on Eliquis.  Thyroid function was normal.  Nebivolol was increased for rate control.  She had an echo 04/02/17 that revealed LVEF 60-65% and mild mitral regurgitation. She was started on lasix for LE edema.  She underwent DCCV 04/2017 but had no change in symptoms.  She had recurrent atrial fibrillation and volume overload after returning to afib.    She was last seen virtually 03/2019 and was doing well.  She followed up with Edd Fabian, NP 07/2020. At that visit her blood pressure was 132/82 and well controlled at home. Heart rate was 90 in clinic, and she was doing well on the increased dose of diltiazem. She reported one event of falling backwards due to unknown cause. She did not trip or feel lightheaded. Today, she has been feeling good overall, and has no new complaints. When her heart rate is elevated she usually does not notice any difference. Typically she feels good most of the time, and does not monitor her blood pressure or heart rate. Recently she started [employer], and her A1C has lowered. Her weight is steadily decreasing. For exercise she does housework and remains active. She is thinking of stopping her exercise class as she does not like the drive. For her eyes, she is taking AREDS vitamins. She denies any palpitations, chest pain, or shortness of breath. No lightheadedness, headaches, syncope,  orthopnea, or PND. Also has no lower extremity edema or exertional symptoms.   Past Medical History:  Diagnosis Date   Diabetes mellitus without complication (HCC)    Essential hypertension 09/19/2017   Hypertension    Past Surgical History:  Procedure Laterality Date   BUNIONECTOMY     CARDIOVERSION N/A 04/13/2017   Procedure: CARDIOVERSION;  Surgeon: Chrystie Nose, MD;  Location: [facility] ENDOSCOPY;  Service: Cardiovascular;  Laterality: N/A;   CHOLECYSTECTOMY  1973   DILATION AND CURETTAGE, DIAGNOSTIC / THERAPEUTIC  1980   TONSILLECTOMY     age 72     Current Meds  Medication Sig   apixaban (ELIQUIS) 5 MG TABS tablet Take 1 tablet (5 mg total) by mouth 2 (two) times daily.   atorvastatin (LIPITOR) 10 MG tablet 1 tablet daily.   Cholecalciferol (VITAMIN D3) 3000 units TABS Take by mouth.   Cyanocobalamin (B-12) 3000 MCG CAPS Take 1 capsule by mouth daily.   furosemide (LASIX) 20 MG tablet Take 1 tablet (20 mg total) by mouth daily.   levothyroxine (SYNTHROID) 25 MCG tablet TAKE 2 TABLET BY MOUTH EVERY DAY IN THE MORNING ON AN EMPTY STOMACH. EXCEPT 2 DAYS EVERY WEEK TAKE 1 TABLETS 30 DAYS   metFORMIN (GLUCOPHAGE-XR) 500 MG 24 hr tablet Take 2 tablets by mouth daily.   Multiple Vitamins-Minerals (ICAPS AREDS 2 PO) Take 1 tablet by mouth 2 (two) times a day.    Multiple Vitamins-Minerals (MULTIVITAMIN WITH MINERALS) tablet Take 1 tablet by mouth daily.   nebivolol (  BYSTOLIC) 10 MG tablet Take 10 mg by mouth daily.   Omega-3 Fatty Acids (FISH OIL) 1000 MG CAPS Take by mouth.   potassium chloride SA (KLOR-CON) 20 MEQ tablet Take 20 mEq by mouth 2 (two) times daily.   valsartan (DIOVAN) 320 MG tablet Take 1 tablet (320 mg total) by mouth daily.   [DISCONTINUED] diltiazem (CARDIZEM CD) 180 MG 24 hr capsule Take 1 capsule (180 mg total) by mouth daily.     Allergies:   Penicillins, Sulfa antibiotics, Amlodipine besylate, Atenolol, and Prolia [denosumab]   Social History   Tobacco Use    Smoking status: Never   Smokeless tobacco: Never  Substance Use Topics   Alcohol use: Yes    Comment: rarely     Family Hx: The patient's family history includes Heart disease in her maternal grandmother; Liver disease in her sister.  ROS:   Please see the history of present illness.    All other systems reviewed and are negative.   Prior CV studies:   The following studies were reviewed today:  Echo 04/02/17: Study Conclusions   - Left ventricle: The cavity size was normal. Wall thickness was   normal. Systolic function was normal. The estimated ejection   fraction was in the range of 60% to 65%. - Mitral valve: There was mild regurgitation. - Pulmonary arteries: PA peak pressure: 37 mm Hg (S).    Labs/Other Tests and Data Reviewed:    EKG:   02/24/2021: Atrial fibrillation. Rate 112 bpm. Low voltage. 05/11/2020: Atrial fibrillation. Rate 114 bpm. Nonspecific ST changes.  Recent Labs: No results found for requested labs within last 8760 hours.   Recent Lipid Panel No results found for: CHOL, TRIG, HDL, CHOLHDL, LDLCALC, LDLDIRECT  Wt Readings from Last 3 Encounters:  02/24/21 172 lb 9.6 oz (78.3 kg)  07/15/20 188 lb 6.4 oz (85.5 kg)  05/11/20 188 lb 9.6 oz (85.5 kg)        VS:  BP 126/80 (BP Location: Right Arm, Patient Position: Sitting)   Pulse 96   Ht 5' (1.524 m)   Wt 172 lb 9.6 oz (78.3 kg)   BMI 33.71 kg/m  , BMI Body mass index is 33.71 kg/m. GENERAL:  Well appearing HEENT: Pupils equal round and reactive, fundi not visualized, oral mucosa unremarkable NECK:  No jugular venous distention, waveform within normal limits, carotid upstroke brisk and symmetric, no bruits LUNGS:  Clear to auscultation bilaterally HEART: Tachycardic.  Irregularly irregular.  PMI not displaced or sustained,S1 and S2 within normal limits, no S3, no S4, no clicks, no rubs, no murmurs ABD:  Flat, positive bowel sounds normal in frequency in pitch, no bruits, no rebound, no  guarding, no midline pulsatile mass, no hepatomegaly, no splenomegaly EXT:  2 plus pulses throughout, no edema, no cyanosis no clubbing SKIN:  No rashes no nodules NEURO:  Cranial nerves II through XII grossly intact, motor grossly intact throughout PSYCH:  Cognitively intact, oriented to person place and time   ASSESSMENT & PLAN:   Persistent atrial fibrillation (HCC) Rates are poorly controlled.  We will increase her diltiazem to 240 mg daily.  She will continue Eliquis and nebivolol.  Essential hypertension Blood pressure was initially elevated but better on repeat.  I suspect that this is likely because of the walked back to the room.  Her blood pressure was well-controlled when she saw her PCP recently.  Heart rates were elevated there as well.  We are increasing diltiazem to 240 mg daily.  Continue nebivolol and valsartan.  CRI (chronic renal insufficiency), stage 3 (moderate) (HCC) Blood pressure control as above.  Continue valsartan.   Medication Adjustments/Labs and Tests Ordered: Current medicines are reviewed at length with the patient today.  Concerns regarding medicines are outlined above.   Tests Ordered: Orders Placed This Encounter  Procedures   EKG 12-Lead    Medication Changes: Meds ordered this encounter  Medications   diltiazem (CARDIZEM CD) 240 MG 24 hr capsule    Sig: Take 1 capsule (240 mg total) by mouth daily.    Dispense:  90 capsule    Refill:  3    NEW DOSE, D/C 180 MG RX    Follow Up:  With Sherolyn Trettin C. Duke Salvia, MD, [facility] in 1 year.   I,Mathew Stumpf,acting as a Neurosurgeon for Chilton Si, MD.,have documented all relevant documentation on the behalf of Chilton Si, MD,as directed by  Chilton Si, MD while in the presence of Chilton Si, MD.  I, Cuauhtemoc Huegel C. Duke Salvia, MD have reviewed all documentation for this visit.  The documentation of the exam, diagnosis, procedures, and orders on 02/24/2021 are all accurate and  complete.   Signed, Chilton Si, MD  02/24/2021 11:35 AM    [facility]

## 2021-02-24 NOTE — Assessment & Plan Note (Signed)
Blood pressure control as above.  Continue valsartan.

## 2021-02-24 NOTE — Assessment & Plan Note (Signed)
Blood pressure was initially elevated but better on repeat.  I suspect that this is likely because of the walked back to the room.  Her blood pressure was well-controlled when she saw her PCP recently.  Heart rates were elevated there as well.  We are increasing diltiazem to 240 mg daily.  Continue nebivolol and valsartan.

## 2021-02-25 ENCOUNTER — Encounter (HOSPITAL_BASED_OUTPATIENT_CLINIC_OR_DEPARTMENT_OTHER): Payer: Self-pay

## 2021-03-24 ENCOUNTER — Other Ambulatory Visit: Payer: Self-pay | Admitting: General Practice

## 2021-03-24 NOTE — Telephone Encounter (Signed)
Prescription refill request for Eliquis received. Indication:AFIB Last office visit:[facility] Aguirre 02/24/21 Scr:1.08 11/03/20 Age: 44F Weight:78.3KG

## 2021-08-29 ENCOUNTER — Ambulatory Visit (HOSPITAL_BASED_OUTPATIENT_CLINIC_OR_DEPARTMENT_OTHER): Payer: Medicare Other | Admitting: Nurse Practitioner

## 2021-08-29 NOTE — Progress Notes (Unsigned)
Office Visit    Patient Name: Kayla Zuniga Date of Encounter: 08/29/2021  Primary Care Provider:  Harlan Stains, MD Primary Cardiologist:  Skeet Latch, MD  Chief Complaint    88 year old female with a history of longstanding persistent atrial fibrillation, hypertension, CKD stage III, and type 2 diabetes who presents for follow-up related to atrial fibrillation and hypertension.  Past Medical History    Past Medical History:  Diagnosis Date   Diabetes mellitus without complication ([place])    Essential hypertension 09/19/2017   Hypertension    Past Surgical History:  Procedure Laterality Date   BUNIONECTOMY     CARDIOVERSION N/A 04/13/2017   Procedure: CARDIOVERSION;  Surgeon: Pixie Casino, MD;  Location: [facility];  Service: Cardiovascular;  Laterality: N/A;   [place] / [place]     age 68    Allergies  Allergies  Allergen Reactions   Penicillins Hives and Itching   Sulfa Antibiotics Itching   Amlodipine Besylate Other (See Comments)   Atenolol Other (See Comments)   Prolia [Denosumab]     Hallucinations/pill form only     History of Present Illness    88 year old female with the above past medical history including longstanding persistent atrial fibrillation, hypertension, CKD stage III, and type 2 diabetes.  She was initially evaluated in 2018 after her PCP found her to be in new atrial fibrillation. She was asymptomatic at the time and was started on Eliquis, rate controlled on beta-blocker therapy.  Echocardiogram in October 2018 showed EF 60 to 65%, mild mitral regurgitation. She was started on Lasix for lower extremity edema.  She underwent DCCV in October 2018. Unfortunately, she returned to atrial fibrillation and continued to have symptoms of volume overload. Additionally, she follows in the hypertension clinic. She was last seen in the office on 02/24/2021 and was noted  to be in atrial fibrillation, her rate was poorly controlled at the time, and her diltiazem was increased.  She presents today for follow-up.  Since her last visit  1.  Longstanding persistent atrial fibrillation: Diagnosed in 2018. S/p DCCV in 2018 with recurrence.  On Eliquis, with rate control strategy on beta-blocker therapy an diltiazem.  Diltiazem was increased at last visit due to poor rate control. 2.  Essential hypertension: BP well controlled. Continue current antihypertensive regimen.   3.  CKD stage III: Creatinine was 1.08 in May 2022. 4: Type 2 diabetes: A1c was 6.4 in May 2022.  Monitored and managed by PCP. 5: Disposition:   Home Medications    Current Outpatient Medications  Medication Sig Dispense Refill   apixaban (ELIQUIS) 5 MG TABS tablet TAKE 1 TABLET BY MOUTH  TWICE DAILY 180 tablet 1   atorvastatin (LIPITOR) 10 MG tablet 1 tablet daily.     Cholecalciferol (VITAMIN D3) 3000 units TABS Take by mouth.     Cyanocobalamin (B-12) 3000 MCG CAPS Take 1 capsule by mouth daily.     diltiazem (CARDIZEM CD) 240 MG 24 hr capsule Take 1 capsule (240 mg total) by mouth daily. 90 capsule 3   furosemide (LASIX) 20 MG tablet Take 1 tablet (20 mg total) by mouth daily. 30 tablet 2   levothyroxine (SYNTHROID) 25 MCG tablet TAKE 2 TABLET BY MOUTH EVERY DAY IN THE MORNING ON AN EMPTY STOMACH. EXCEPT 2 DAYS EVERY WEEK TAKE 1 TABLETS 30 DAYS     metFORMIN (GLUCOPHAGE-XR) 500 MG 24 hr tablet Take 2  tablets by mouth daily.     Multiple Vitamins-Minerals (ICAPS AREDS 2 PO) Take 1 tablet by mouth 2 (two) times a day.      Multiple Vitamins-Minerals (MULTIVITAMIN WITH MINERALS) tablet Take 1 tablet by mouth daily.     nebivolol (BYSTOLIC) 10 MG tablet Take 10 mg by mouth daily.     Omega-3 Fatty Acids (FISH OIL) 1000 MG CAPS Take by mouth.     potassium chloride SA (KLOR-CON) 20 MEQ tablet Take 20 mEq by mouth 2 (two) times daily.     valsartan (DIOVAN) 320 MG tablet Take 1 tablet (320 mg  total) by mouth daily. 30 tablet 2   No current facility-administered medications for this visit.     Review of Systems    ***.  All other systems reviewed and are otherwise negative except as noted above.    Physical Exam    VS:  There were no vitals taken for this visit. , BMI There is no height or weight on file to calculate BMI.     GEN: Well nourished, well developed, in no acute distress. HEENT: normal. Neck: Supple, no JVD, carotid bruits, or masses. Cardiac: RRR, no murmurs, rubs, or gallops. No clubbing, cyanosis, edema.  Radials/DP/PT 2+ and equal bilaterally.  Respiratory:  Respirations regular and unlabored, clear to auscultation bilaterally. GI: Soft, nontender, nondistended, BS + x 4. MS: no deformity or atrophy. Skin: warm and dry, no rash. Neuro:  Strength and sensation are intact. Psych: Normal affect.  Accessory Clinical Findings    ECG personally reviewed by me today - *** - no acute changes.  Lab Results  Component Value Date   WBC 6.8 04/06/2017   HGB 13.1 04/06/2017   HCT 37.7 04/06/2017   MCV 97.9 04/06/2017   PLT 265 04/06/2017   Lab Results  Component Value Date   CREATININE 1.04 (H) 04/24/2017   BUN 16 04/24/2017   NA 138 04/24/2017   K 3.8 04/24/2017   CL 97 (L) 04/24/2017   CO2 29 04/24/2017   No results found for: ALT, AST, GGT, ALKPHOS, BILITOT No results found for: CHOL, HDL, LDLCALC, LDLDIRECT, TRIG, CHOLHDL  No results found for: HGBA1C  Assessment & Plan    1.  ***   Lenna Sciara, NP 08/29/2021, 5:18 AM

## 2021-09-12 ENCOUNTER — Other Ambulatory Visit: Payer: Self-pay | Admitting: Cardiovascular Disease

## 2021-09-12 ENCOUNTER — Encounter (HOSPITAL_COMMUNITY): Payer: Self-pay

## 2021-09-12 ENCOUNTER — Other Ambulatory Visit: Payer: Self-pay

## 2021-09-12 ENCOUNTER — Emergency Department (HOSPITAL_COMMUNITY)
Admission: EM | Admit: 2021-09-12 | Discharge: 2021-09-15 | Disposition: A | Discharge status: Home / Self Care | Payer: Medicare Other | Attending: Emergency Medicine | Admitting: Emergency Medicine

## 2021-09-12 DIAGNOSIS — Z20822 Contact with and (suspected) exposure to covid-19: Secondary | ICD-10-CM | POA: Insufficient documentation

## 2021-09-12 DIAGNOSIS — Z7901 Long term (current) use of anticoagulants: Secondary | ICD-10-CM | POA: Diagnosis not present

## 2021-09-12 DIAGNOSIS — E1122 Type 2 diabetes mellitus with diabetic chronic kidney disease: Secondary | ICD-10-CM | POA: Diagnosis not present

## 2021-09-12 DIAGNOSIS — N189 Chronic kidney disease, unspecified: Secondary | ICD-10-CM | POA: Diagnosis not present

## 2021-09-12 DIAGNOSIS — F29 Unspecified psychosis not due to a substance or known physiological condition: Secondary | ICD-10-CM | POA: Insufficient documentation

## 2021-09-12 DIAGNOSIS — E1165 Type 2 diabetes mellitus with hyperglycemia: Secondary | ICD-10-CM | POA: Insufficient documentation

## 2021-09-12 DIAGNOSIS — D7589 Other specified diseases of blood and blood-forming organs: Secondary | ICD-10-CM | POA: Insufficient documentation

## 2021-09-12 DIAGNOSIS — I129 Hypertensive chronic kidney disease with stage 1 through stage 4 chronic kidney disease, or unspecified chronic kidney disease: Secondary | ICD-10-CM | POA: Diagnosis not present

## 2021-09-12 DIAGNOSIS — R443 Hallucinations, unspecified: Secondary | ICD-10-CM | POA: Diagnosis present

## 2021-09-12 DIAGNOSIS — R441 Visual hallucinations: Secondary | ICD-10-CM

## 2021-09-12 DIAGNOSIS — R4182 Altered mental status, unspecified: Secondary | ICD-10-CM | POA: Diagnosis not present

## 2021-09-12 LAB — COMPREHENSIVE METABOLIC PANEL
ALT: 18 U/L (ref 0–44)
AST: 34 U/L (ref 15–41)
Albumin: 4.1 g/dL (ref 3.5–5.0)
Alkaline Phosphatase: 41 U/L (ref 38–126)
Anion gap: 8 (ref 5–15)
BUN: 26 mg/dL — ABNORMAL HIGH (ref 8–23)
CO2: 22 mmol/L (ref 22–32)
Calcium: 8.8 mg/dL — ABNORMAL LOW (ref 8.9–10.3)
Chloride: 106 mmol/L (ref 98–111)
Creatinine, Ser: 1.01 mg/dL — ABNORMAL HIGH (ref 0.44–1.00)
GFR, Estimated: 55 mL/min — ABNORMAL LOW (ref >60)
Glucose, Bld: 124 mg/dL — ABNORMAL HIGH (ref 70–99)
Potassium: 5.2 mmol/L — ABNORMAL HIGH (ref 3.5–5.1)
Sodium: 136 mmol/L (ref 135–145)
Total Bilirubin: 1.1 mg/dL (ref 0.3–1.2)
Total Protein: 7.8 g/dL (ref 6.5–8.1)

## 2021-09-12 LAB — CBC WITH DIFFERENTIAL/PLATELET
Abs Immature Granulocytes: 0.02 10*3/uL (ref 0.00–0.07)
Basophils Absolute: 0 10*3/uL (ref 0.0–0.1)
Basophils Relative: 0 %
Eosinophils Absolute: 0 10*3/uL (ref 0.0–0.5)
Eosinophils Relative: 1 %
HCT: 40.6 % (ref 36.0–46.0)
Hemoglobin: 13.6 g/dL (ref 12.0–15.0)
Immature Granulocytes: 0 %
Lymphocytes Relative: 16 %
Lymphs Abs: 1.1 10*3/uL (ref 0.7–4.0)
MCH: 34.4 pg — ABNORMAL HIGH (ref 26.0–34.0)
MCHC: 33.5 g/dL (ref 30.0–36.0)
MCV: 102.8 fL — ABNORMAL HIGH (ref 80.0–100.0)
Monocytes Absolute: 0.6 10*3/uL (ref 0.1–1.0)
Monocytes Relative: 9 %
Neutro Abs: 5 10*3/uL (ref 1.7–7.7)
Neutrophils Relative %: 74 %
Platelets: 235 10*3/uL (ref 150–400)
RBC: 3.95 MIL/uL (ref 3.87–5.11)
RDW: 13.1 % (ref 11.5–15.5)
WBC: 6.9 10*3/uL (ref 4.0–10.5)
nRBC: 0 % (ref 0.0–0.2)

## 2021-09-12 LAB — PROTIME-INR
INR: 1.6 — ABNORMAL HIGH (ref 0.8–1.2)
Prothrombin Time: 18.6 seconds — ABNORMAL HIGH (ref 11.4–15.2)

## 2021-09-12 MED ORDER — LACTATED RINGERS IV BOLUS
1000.0000 mL | Freq: Once | INTRAVENOUS | Status: AC
Start: 2021-09-13 — End: 2021-09-13
  Administered 2021-09-13: 1000 mL via INTRAVENOUS

## 2021-09-12 NOTE — ED Triage Notes (Signed)
Pt was diagnosed with a UTI but refuses to take her prescribed Abts. Her son states that she has been hallucinating x 3 months.  ?

## 2021-09-12 NOTE — ED Provider Notes (Signed)
?[facility]-EMERGENCY DEPT ?Provider Note ? ? ?CSN: [id number] ?Arrival date & time: 09/12/21  1959 ? ?  ? ?History ? ?Chief Complaint  ?Patient presents with  ? Urinary Tract Infection  ? Hallucinations  ? ? ?Kayla Zuniga is a 88 y.o. female. ? ?The history is provided by a relative and the patient. History limited by: Altered mental status.  ?Urinary Tract Infection ?She has history of hypertension, diabetes, persistent atrial fibrillation anticoagulated on apixaban, chronic kidney disease and comes in because of hallucinations.  She has been having hallucinations for about the last week.  Hallucinations consist of her seeing people who are not there.  She states it looks like they are trying to talk to her, but she does not hear any voices.  Some of the people she recognizes, some she does not.  She has not had problems with hallucinations before.  She went to her primary care provider today who diagnosed urinary tract infection and prescribed ciprofloxacin.  She has taken 1 dose.  Her son says he does not think she has been urinating as much as she normally does and does not think she has been drinking sufficient fluids.  She has not had any fever or chills.  There has been no vomiting or diarrhea.  There have been no other medication changes.  Other than hallucinations, patient's behavior has been normal.  She denies homicidal or suicidal ideation. ?  ?Home Medications ?Prior to Admission medications   ?Medication Sig Start Date End Date Taking? Authorizing Provider  ?apixaban (ELIQUIS) 5 MG TABS tablet TAKE 1 TABLET BY MOUTH  TWICE DAILY 03/24/21   Chilton Si, MD  ?atorvastatin (LIPITOR) 10 MG tablet 1 tablet daily. 02/20/17   [provider]  ?Cholecalciferol (VITAMIN D3) 3000 units TABS Take by mouth.    [provider]  ?Cyanocobalamin (B-12) 3000 MCG CAPS Take 1 capsule by mouth daily.    [provider]  ?diltiazem (CARDIZEM CD) 240 MG 24 hr capsule  Take 1 capsule (240 mg total) by mouth daily. 02/24/21   Chilton Si, MD  ?furosemide (LASIX) 20 MG tablet Take 1 tablet (20 mg total) by mouth daily. 04/24/17   Newman Nip, NP  ?levothyroxine (SYNTHROID) 25 MCG tablet TAKE 2 TABLET BY MOUTH EVERY DAY IN THE MORNING ON AN EMPTY STOMACH. EXCEPT 2 DAYS EVERY WEEK TAKE 1 TABLETS 30 DAYS    [provider]  ?metFORMIN (GLUCOPHAGE-XR) 500 MG 24 hr tablet Take 2 tablets by mouth daily. 02/20/17   [provider]  ?Multiple Vitamins-Minerals (ICAPS AREDS 2 PO) Take 1 tablet by mouth 2 (two) times a day.     [provider]  ?Multiple Vitamins-Minerals (MULTIVITAMIN WITH MINERALS) tablet Take 1 tablet by mouth daily.    [provider]  ?nebivolol (BYSTOLIC) 10 MG tablet Take 10 mg by mouth daily.    [provider]  ?Omega-3 Fatty Acids (FISH OIL) 1000 MG CAPS Take by mouth.    [provider]  ?potassium chloride SA (KLOR-CON) 20 MEQ tablet Take 20 mEq by mouth 2 (two) times daily. 04/29/20   [provider]  ?valsartan (DIOVAN) 320 MG tablet Take 1 tablet (320 mg total) by mouth daily. 04/03/17   Newman Nip, NP  ?   ? ?Allergies    ?Penicillins, Sulfa antibiotics, Amlodipine besylate, Atenolol, and Prolia [denosumab]   ? ?Review of Systems   ?Review of Systems  ?Unable to perform ROS: Mental status change  ?All  other systems reviewed and are negative. ? ?Physical Exam ?Updated Vital Signs ?BP (!) 144/100   Pulse (!) 119   Temp 97.6 ?F (36.4 ?C) (Oral)   Resp 19   Ht 5' (1.524 m)   SpO2 100%   BMI 33.71 kg/m?  ?Physical Exam ?Vitals and nursing note reviewed.  ?88 year old female, resting comfortably and in no acute distress. Vital signs are significant for elevated blood pressure and heart rate. Oxygen saturation is 100%, which is normal. ?Head is normocephalic and atraumatic. PERRLA, EOMI. Oropharynx is clear. ?Neck is nontender and supple without adenopathy or JVD. ?Back is nontender  and there is no CVA tenderness. ?Lungs are clear without rales, wheezes, or rhonchi. ?Chest is nontender. ?Heart is tachycardic and irregular without murmur. ?Abdomen is soft, flat, nontender. ?Extremities have trace edema, full range of motion is present. ?Skin is warm and dry without rash. ?Neurologic: Mental status is normal, cranial nerves are intact, moves all extremities equally. ? ?ED Results / Procedures / Treatments   ?Labs ?(all labs ordered are listed, but only abnormal results are displayed) ?Labs Reviewed  ?CBC WITH DIFFERENTIAL/PLATELET - Abnormal; Notable for the following components:  ?    Result Value  ? MCV 102.8 (*)   ? MCH 34.4 (*)   ? All other components within normal limits  ?COMPREHENSIVE METABOLIC PANEL - Abnormal; Notable for the following components:  ? Potassium 5.2 (*)   ? Glucose, Bld 124 (*)   ? BUN 26 (*)   ? Creatinine, Ser 1.01 (*)   ? Calcium 8.8 (*)   ? GFR, Estimated 55 (*)   ? All other components within normal limits  ?PROTIME-INR - Abnormal; Notable for the following components:  ? Prothrombin Time 18.6 (*)   ? INR 1.6 (*)   ? All other components within normal limits  ?URINALYSIS, ROUTINE W REFLEX MICROSCOPIC - Abnormal; Notable for the following components:  ? Ketones, ur 20 (*)   ? Leukocytes,Ua SMALL (*)   ? All other components within normal limits  ?URINE CULTURE  ? ?Radiology ?No results found. ? ?Procedures ?Procedures  ?Cardiac monitor shows atrial fibrillation with rapid ventricular response, per my interpretation. ? ?Medications Ordered in ED ?Medications - No data to display ? ?ED Course/ Medical Decision Making/ A&P ?  ?                        ?Medical Decision Making ?Amount and/or Complexity of Data Reviewed ?Radiology: ordered. ? ?Risk ?OTC drugs. ?Prescription drug management. ? ? ?Hallucinations without any other mental status changes, possible effect of infection such as urinary tract infection.  No evidence of primary psychiatric pathology.  Old records are  reviewed, and I do see report of an office visit today, but note and results are not available.  CBC and metabolic panel show mild renal insufficiency unchanged from baseline, borderline hyperkalemia which is not felt to be clinically significant, macrocytosis which is probably not clinically significant.  We will need to check urinalysis.  Also, atrial fibrillation with rapid ventricular response, inadequate rate control with her current regimen of both beta-blockers and calcium channel blockers.  We will give some IV fluids and small dose of metoprolol. ? ?Heart rate has not changed significantly with above-noted treatment.  Urinalysis shows no evidence of infection.  It is still possible she had a UTI and it has responded well to initial dose of ciprofloxacin, although that is considered unlikely.  I suspect her  hallucinations are primary.  I discussed outpatient psychiatry referral, but patient son is uncomfortable with this.  He states that patient had told him that she was worried that the hallucinations were going to harm her.  She actually denied this on direct questioning for me.  However, she is agreeable to TTS evaluation. ? ?TTS consult is still pending.  Case is signed out to Dr. Freida Busman, oncoming provider. ? ? ? ? ? ? ? ?Final Clinical Impression(s) / ED Diagnoses ?Final diagnoses:  ?Visual hallucinations  ?Chronic anticoagulation  ?Macrocytosis without anemia  ? ? ?Rx / DC Orders ?ED Discharge Orders   ? ? None  ? ?  ? ? ?  ?Dione Booze, MD ?09/13/21 9628 ? ?

## 2021-09-12 NOTE — ED Provider Notes (Incomplete)
[facility]   CSN: [id number] Arrival date & time: 09/12/21  1959     History {Add pertinent medical, surgical, social history, OB history to HPI:1} Chief Complaint  Patient presents with   Urinary Tract Infection   Hallucinations    Kayla Zuniga is a 88 y.o. female.  The history is provided by a relative and the patient. History limited by: Altered mental status.  Urinary Tract Infection She has history of hypertension, diabetes, persistent atrial fibrillation anticoagulated on apixaban, chronic kidney disease and comes in because of hallucinations.  She has been having hallucinations for about the last week.  Hallucinations consist of her seeing people who are not there.  She states it looks like they are trying to talk to her, but she does not hear any voices.  Some of the people she recognizes, some she does not.  She has not had problems with hallucinations before.  She went to her primary care provider today who diagnosed urinary tract infection and prescribed ciprofloxacin.  She has taken 1 dose.  Her son says he does not think she has been urinating as much as she   Home Medications Prior to Admission medications   Medication Sig Start Date End Date Taking? Authorizing Provider  apixaban (ELIQUIS) 5 MG TABS tablet TAKE 1 TABLET BY MOUTH  TWICE DAILY 03/24/21   Chilton Si, MD  atorvastatin (LIPITOR) 10 MG tablet 1 tablet daily. 02/20/17   [provider]  Cholecalciferol (VITAMIN D3) 3000 units TABS Take by mouth.    [provider]  Cyanocobalamin (B-12) 3000 MCG CAPS Take 1 capsule by mouth daily.    [provider]  diltiazem (CARDIZEM CD) 240 MG 24 hr capsule Take 1 capsule (240 mg total) by mouth daily. 02/24/21   Chilton Si, MD  furosemide (LASIX) 20 MG tablet Take 1 tablet (20 mg total) by mouth daily. 04/24/17   Newman Nip, NP  levothyroxine (SYNTHROID) 25 MCG tablet TAKE 2  TABLET BY MOUTH EVERY DAY IN THE MORNING ON AN EMPTY STOMACH. EXCEPT 2 DAYS EVERY WEEK TAKE 1 TABLETS 30 DAYS    [provider]  metFORMIN (GLUCOPHAGE-XR) 500 MG 24 hr tablet Take 2 tablets by mouth daily. 02/20/17   [provider]  Multiple Vitamins-Minerals (ICAPS AREDS 2 PO) Take 1 tablet by mouth 2 (two) times a day.     [provider]  Multiple Vitamins-Minerals (MULTIVITAMIN WITH MINERALS) tablet Take 1 tablet by mouth daily.    [provider]  nebivolol (BYSTOLIC) 10 MG tablet Take 10 mg by mouth daily.    [provider]  Omega-3 Fatty Acids (FISH OIL) 1000 MG CAPS Take by mouth.    [provider]  potassium chloride SA (KLOR-CON) 20 MEQ tablet Take 20 mEq by mouth 2 (two) times daily. 04/29/20   [provider]  valsartan (DIOVAN) 320 MG tablet Take 1 tablet (320 mg total) by mouth daily. 04/03/17   Newman Nip, NP      Allergies    Penicillins, Sulfa antibiotics, Amlodipine besylate, Atenolol, and Prolia [denosumab]    Review of Systems   Review of Systems  Unable to perform ROS: Mental status change  All other systems reviewed and are negative.  Physical Exam Updated Vital Signs BP (!) 144/100    Pulse (!) 119    Temp 97.6 F (36.4 C) (Oral)    Resp 19    Ht 5' (1.524 m)  SpO2 100%    BMI 33.71 kg/m  Physical Exam Vitals and nursing note reviewed.  88 year old female, resting comfortably and in no acute distress. Vital signs are ***. Oxygen saturation is ***%, which is normal. Head is normocephalic and atraumatic. PERRLA, EOMI. Oropharynx is clear. Neck is nontender and supple without adenopathy or JVD. Back is nontender and there is no CVA tenderness. Lungs are clear without rales, wheezes, or rhonchi. Chest is nontender. Heart has regular rate and rhythm without murmur. Abdomen is soft, flat, nontender without masses or hepatosplenomegaly and peristalsis is normoactive. Extremities have no cyanosis  or edema, full range of motion is present. Skin is warm and dry without rash. Neurologic: Mental status is normal, cranial nerves are intact, there are no motor or sensory deficits.  ED Results / Procedures / Treatments   Labs (all labs ordered are listed, but only abnormal results are displayed) Labs Reviewed  CBC WITH DIFFERENTIAL/PLATELET - Abnormal; Notable for the following components:      Result Value   MCV 102.8 (*)    MCH 34.4 (*)    All other components within normal limits  COMPREHENSIVE METABOLIC PANEL - Abnormal; Notable for the following components:   Potassium 5.2 (*)    Glucose, Bld 124 (*)    BUN 26 (*)    Creatinine, Ser 1.01 (*)    Calcium 8.8 (*)    GFR, Estimated 55 (*)    All other components within normal limits  PROTIME-INR - Abnormal; Notable for the following components:   Prothrombin Time 18.6 (*)    INR 1.6 (*)    All other components within normal limits  URINE CULTURE  URINALYSIS, ROUTINE W REFLEX MICROSCOPIC    EKG None  Radiology No results found.  Procedures Procedures  {Document cardiac monitor, telemetry assessment procedure when appropriate:1}  Medications Ordered in ED Medications - No data to display  ED Course/ Medical Decision Making/ A&P                           Medical Decision Making  ***  {Document critical care time when appropriate:1} {Document review of labs and clinical decision tools ie heart score, Chads2Vasc2 etc:1}  {Document your independent review of radiology images, and any outside records:1} {Document your discussion with family members, caretakers, and with consultants:1} {Document social determinants of health affecting pt's care:1} {Document your decision making why or why not admission, treatments were needed:1} Final Clinical Impression(s) / ED Diagnoses Final diagnoses:  None    Rx / DC Orders ED Discharge Orders     None

## 2021-09-12 NOTE — ED Provider Triage Note (Signed)
Emergency Medicine Provider Triage Evaluation Note ? ?Kern Reap , a 88 y.o. female  was evaluated in triage.  Pt complains of urinary tract infection diagnosed today. History obtained from son, who reports patient has been hallucinating that someone is out to kill her in her home. Patient does not want to take the antibiotics prescribed by PCP. ? ?Review of Systems  ?Positive: Urinary symptoms, hallucinations ?Negative: Fever, chills, back pain ? ?Physical Exam  ?BP (!) 150/107 (BP Location: Right Arm)   Pulse 88   Temp 97.6 ?F (36.4 ?C) (Oral)   Resp 12   Ht 5' (1.524 m)   SpO2 100%   BMI 33.71 kg/m?  ?Gen:   Awake, no distress   ?Resp:  Normal effort  ?MSK:   Moves extremities without difficulty  ?Other:  Sleepy but arousal, voicing "I dont want to go home because they will kill me",  ? ?Medical Decision Making  ?Medically screening exam initiated at 8:58 PM.  Appropriate orders placed.  SRISTI OLLILA was informed that the remainder of the evaluation will be completed by another provider, this initial triage assessment does not replace that evaluation, and the importance of remaining in the ED until their evaluation is complete. ? ? ?  ?Janeece Fitting, PA-C ?09/12/21 2059 ? ?

## 2021-09-13 ENCOUNTER — Emergency Department (HOSPITAL_COMMUNITY): Payer: Medicare Other

## 2021-09-13 LAB — RESP PANEL BY RT-PCR (FLU A&B, COVID) ARPGX2
Influenza A by PCR: NEGATIVE
Influenza B by PCR: NEGATIVE
SARS Coronavirus 2 by RT PCR: NEGATIVE

## 2021-09-13 LAB — URINALYSIS, ROUTINE W REFLEX MICROSCOPIC
Bacteria, UA: NONE SEEN
Bilirubin Urine: NEGATIVE
Glucose, UA: NEGATIVE mg/dL
Hgb urine dipstick: NEGATIVE
Ketones, ur: 20 mg/dL — AB
Nitrite: NEGATIVE
Protein, ur: NEGATIVE mg/dL
Specific Gravity, Urine: 1.016 (ref 1.005–1.030)
pH: 5 (ref 5.0–8.0)

## 2021-09-13 LAB — BASIC METABOLIC PANEL
Anion gap: 9 (ref 5–15)
BUN: 22 mg/dL (ref 8–23)
CO2: 22 mmol/L (ref 22–32)
Calcium: 8.4 mg/dL — ABNORMAL LOW (ref 8.9–10.3)
Chloride: 108 mmol/L (ref 98–111)
Creatinine, Ser: 0.68 mg/dL (ref 0.44–1.00)
GFR, Estimated: >60 mL/min (ref >60)
Glucose, Bld: 186 mg/dL — ABNORMAL HIGH (ref 70–99)
Potassium: 4.5 mmol/L (ref 3.5–5.1)
Sodium: 139 mmol/L (ref 135–145)

## 2021-09-13 LAB — VITAMIN B12: Vitamin B-12: 290 pg/mL (ref 180–914)

## 2021-09-13 LAB — TSH: TSH: 5.223 u[IU]/mL — ABNORMAL HIGH (ref 0.350–4.500)

## 2021-09-13 LAB — CBG MONITORING, ED: Glucose-Capillary: 108 mg/dL — ABNORMAL HIGH (ref 70–99)

## 2021-09-13 IMAGING — CR DG CHEST 2V
2 series · 2 of 2 positions shown · non-contrast
Comparison: None.

CLINICAL DATA: Altered mental status.

EXAM:
CHEST - 2 VIEW

[w chest lat]
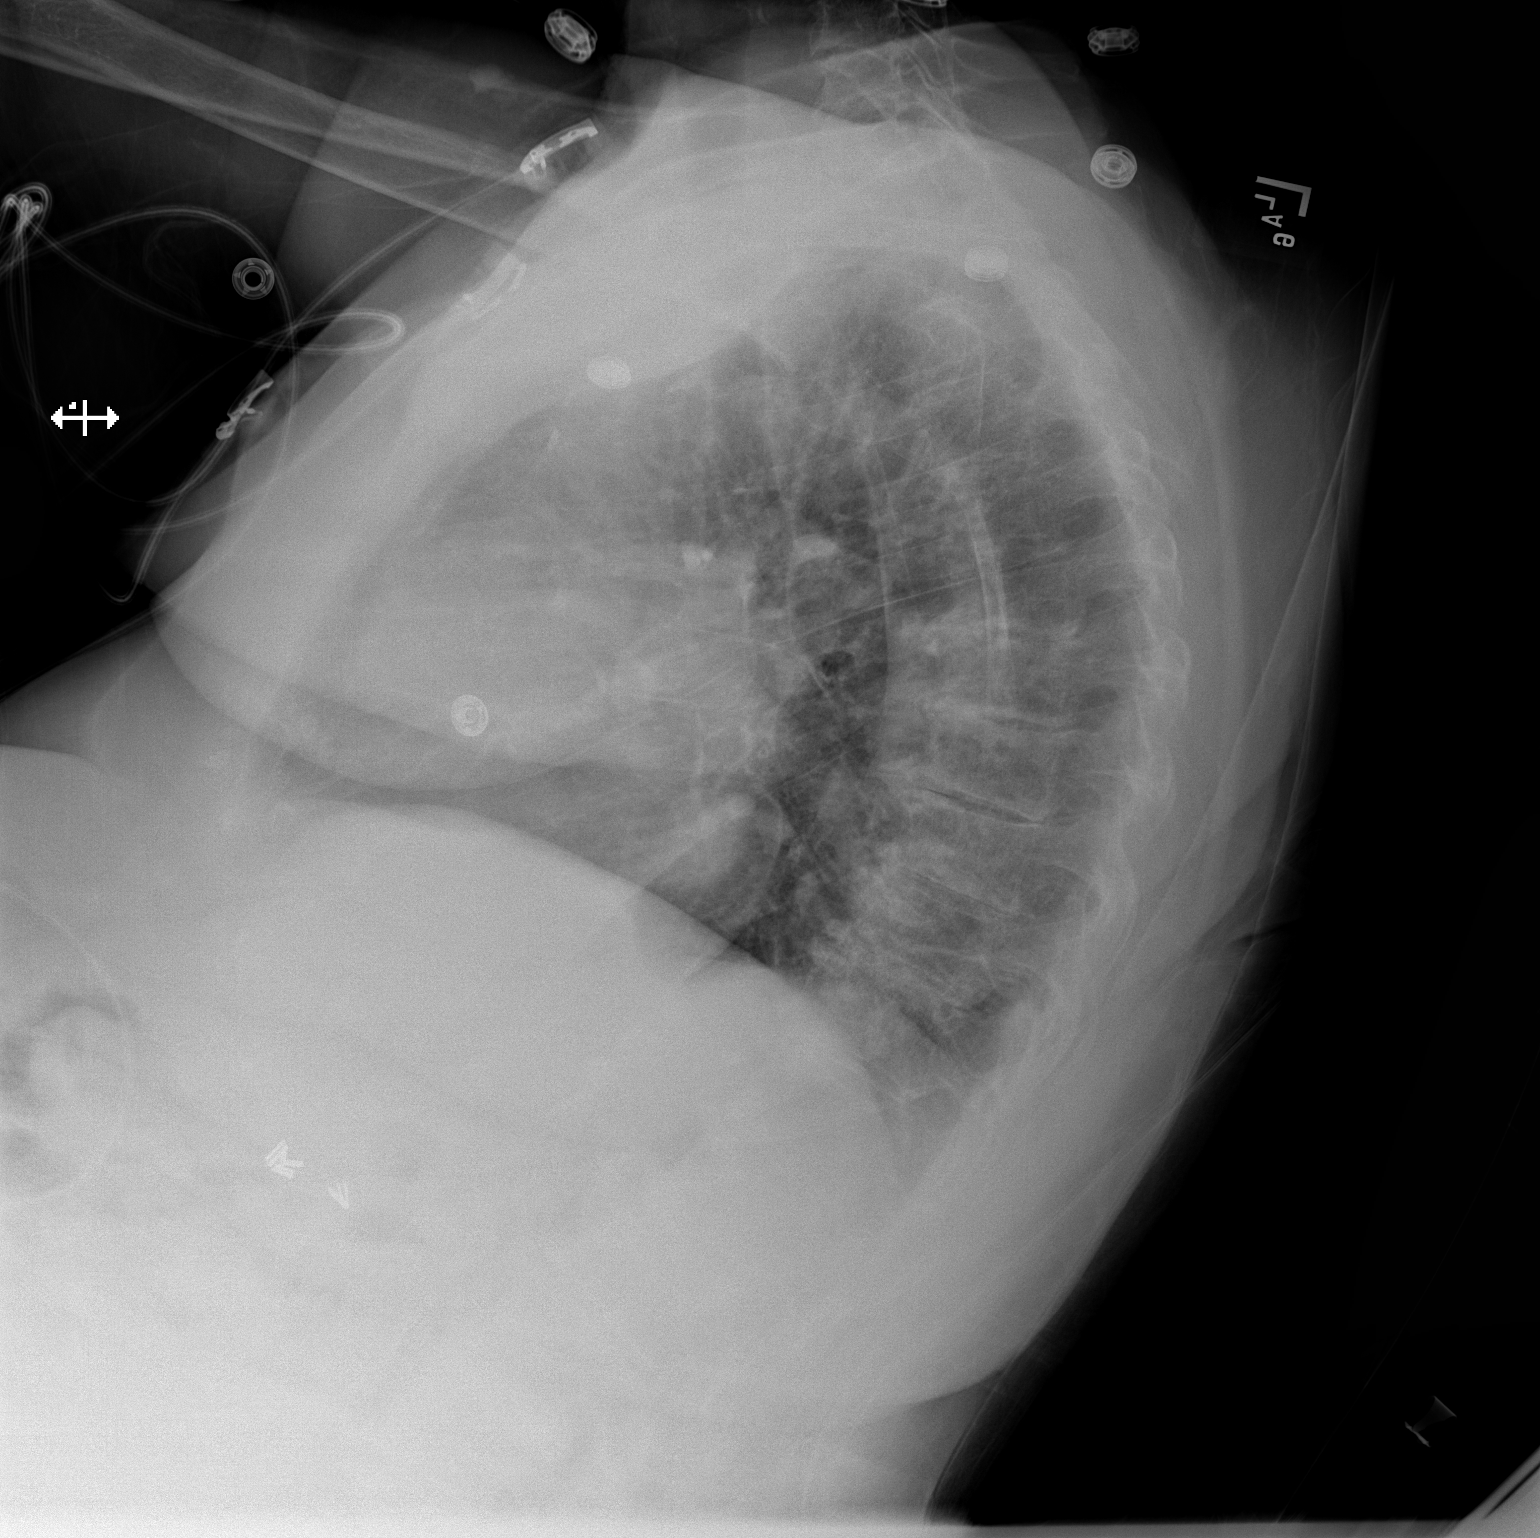

[x chest ap]
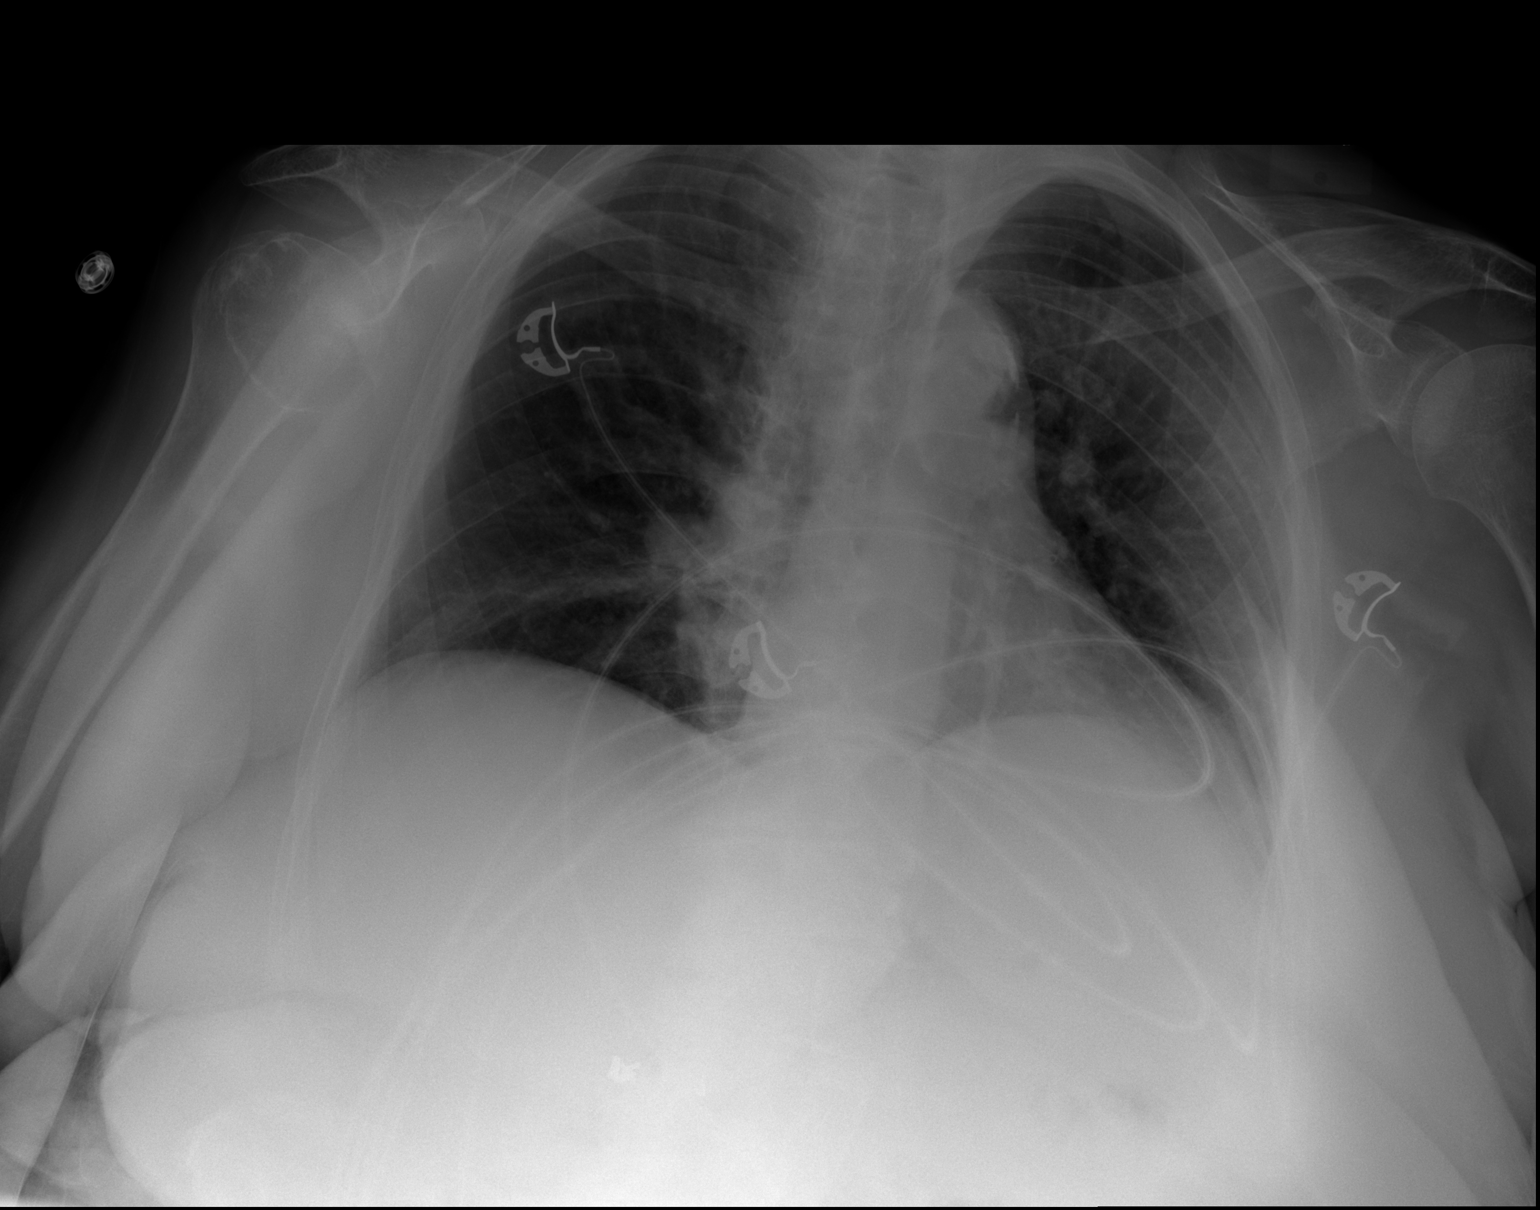

[2 of 2 positions shown; findings below may reference images not displayed]

FINDINGS: Shallow inspiration. No focal consolidation, pleural effusion,
pneumothorax. The cardiac silhouette is within limits.
Atherosclerotic calcification of the aorta. Degenerative changes of
the spine. Subluxed appearance of the right shoulder which may be
positional. If there is clinical concern for right shoulder
dislocation, dedicated shoulder radiographs recommended for better
evaluation.
IMPRESSION: No active cardiopulmonary disease.

## 2021-09-13 MED ORDER — METOPROLOL TARTRATE 5 MG/5ML IV SOLN
2.5000 mg | Freq: Once | INTRAVENOUS | Status: DC
  Filled 2021-09-13: qty 5

## 2021-09-13 MED ORDER — ACETAMINOPHEN 325 MG PO TABS: 650.0000 mg | ORAL_TABLET | ORAL | Status: DC | PRN

## 2021-09-13 MED ORDER — DILTIAZEM HCL ER COATED BEADS 120 MG PO CP24: 240.0000 mg | ORAL_CAPSULE | Freq: Every day | ORAL | Status: DC

## 2021-09-13 MED ORDER — FUROSEMIDE 20 MG PO TABS
20.0000 mg | ORAL_TABLET | Freq: Every day | ORAL | Status: DC
  Administered 2021-09-13 – 2021-09-15 (×3): 20 mg via ORAL
  Filled 2021-09-13 (×3): qty 1

## 2021-09-13 MED ORDER — OMEGA-3-ACID ETHYL ESTERS 1 G PO CAPS
1.0000 g | ORAL_CAPSULE | Freq: Every day | ORAL | Status: DC
  Administered 2021-09-13 – 2021-09-15 (×3): 1 g via ORAL
  Filled 2021-09-13 (×3): qty 1

## 2021-09-13 MED ORDER — ADULT MULTIVITAMIN W/MINERALS CH
1.0000 | ORAL_TABLET | Freq: Every day | ORAL | Status: DC
  Administered 2021-09-13 – 2021-09-15 (×3): 1 via ORAL
  Filled 2021-09-13 (×3): qty 1

## 2021-09-13 MED ORDER — IRBESARTAN 300 MG PO TABS
300.0000 mg | ORAL_TABLET | Freq: Every day | ORAL | Status: DC
  Administered 2021-09-13 – 2021-09-15 (×3): 300 mg via ORAL
  Filled 2021-09-13 (×3): qty 1

## 2021-09-13 MED ORDER — ALUM & MAG HYDROXIDE-SIMETH 200-200-20 MG/5ML PO SUSP: 30.0000 mL | Freq: Four times a day (QID) | ORAL | Status: DC | PRN

## 2021-09-13 MED ORDER — LEVOTHYROXINE SODIUM 50 MCG PO TABS: 50.0000 ug | ORAL_TABLET | Freq: Every day | ORAL | Status: DC

## 2021-09-13 MED ORDER — VITAMIN C 250 MG PO TABS
250.0000 mg | ORAL_TABLET | Freq: Every day | ORAL | Status: DC
  Administered 2021-09-13 – 2021-09-15 (×3): 250 mg via ORAL
  Filled 2021-09-13 (×3): qty 1

## 2021-09-13 MED ORDER — METOPROLOL TARTRATE 5 MG/5ML IV SOLN
2.5000 mg | Freq: Once | INTRAVENOUS | Status: AC
  Administered 2021-09-13: 2.5 mg via INTRAVENOUS
  Filled 2021-09-13: qty 5

## 2021-09-13 MED ORDER — B-12 3000 MCG PO CAPS: 1.0000 | ORAL_CAPSULE | Freq: Every day | ORAL | Status: DC

## 2021-09-13 MED ORDER — LEVOTHYROXINE SODIUM 50 MCG PO TABS
50.0000 ug | ORAL_TABLET | Freq: Every day | ORAL | Status: DC
  Administered 2021-09-13 – 2021-09-15 (×2): 50 ug via ORAL
  Filled 2021-09-13 (×2): qty 1

## 2021-09-13 MED ORDER — NEBIVOLOL HCL 10 MG PO TABS
10.0000 mg | ORAL_TABLET | Freq: Every day | ORAL | Status: DC
  Administered 2021-09-13 – 2021-09-15 (×3): 10 mg via ORAL
  Filled 2021-09-13 (×3): qty 1

## 2021-09-13 MED ORDER — ATORVASTATIN CALCIUM 10 MG PO TABS
10.0000 mg | ORAL_TABLET | Freq: Every day | ORAL | Status: DC
  Administered 2021-09-13 – 2021-09-15 (×3): 10 mg via ORAL
  Filled 2021-09-13 (×3): qty 1

## 2021-09-13 MED ORDER — METFORMIN HCL ER 500 MG PO TB24
1000.0000 mg | ORAL_TABLET | Freq: Every day | ORAL | Status: DC
Start: 2021-09-13 — End: 2021-09-15
  Administered 2021-09-13 – 2021-09-15 (×3): 1000 mg via ORAL
  Filled 2021-09-13 (×3): qty 2

## 2021-09-13 MED ORDER — CIPROFLOXACIN HCL 500 MG PO TABS
500.0000 mg | ORAL_TABLET | Freq: Two times a day (BID) | ORAL | Status: DC
  Administered 2021-09-13 – 2021-09-14 (×2): 500 mg via ORAL
  Filled 2021-09-13 (×3): qty 1

## 2021-09-13 MED ORDER — APIXABAN 5 MG PO TABS
5.0000 mg | ORAL_TABLET | Freq: Two times a day (BID) | ORAL | Status: DC
  Administered 2021-09-13 – 2021-09-15 (×4): 5 mg via ORAL
  Filled 2021-09-13 (×5): qty 1

## 2021-09-13 MED ORDER — VITAMIN D 25 MCG (1000 UNIT) PO TABS
3000.0000 [IU] | ORAL_TABLET | Freq: Every day | ORAL | Status: DC
  Administered 2021-09-13 – 2021-09-15 (×3): 3000 [IU] via ORAL
  Filled 2021-09-13 (×3): qty 3

## 2021-09-13 MED ORDER — DILTIAZEM HCL 25 MG/5ML IV SOLN
20.0000 mg | Freq: Once | INTRAVENOUS | Status: AC
  Administered 2021-09-13: 20 mg via INTRAVENOUS
  Filled 2021-09-13: qty 5

## 2021-09-13 MED ORDER — DILTIAZEM HCL ER COATED BEADS 120 MG PO CP24
120.0000 mg | ORAL_CAPSULE | Freq: Every day | ORAL | Status: DC
  Administered 2021-09-13 – 2021-09-15 (×3): 120 mg via ORAL
  Filled 2021-09-13 (×3): qty 1

## 2021-09-13 MED ORDER — POTASSIUM CHLORIDE CRYS ER 20 MEQ PO TBCR
20.0000 meq | EXTENDED_RELEASE_TABLET | Freq: Two times a day (BID) | ORAL | Status: DC
  Administered 2021-09-14 – 2021-09-15 (×3): 20 meq via ORAL
  Filled 2021-09-13 (×4): qty 1

## 2021-09-13 MED ORDER — ONDANSETRON HCL 4 MG PO TABS: 4.0000 mg | ORAL_TABLET | Freq: Three times a day (TID) | ORAL | Status: DC | PRN

## 2021-09-13 MED ORDER — LORAZEPAM 2 MG/ML IJ SOLN
1.0000 mg | Freq: Once | INTRAMUSCULAR | Status: AC
Start: 2021-09-13 — End: 2021-09-13
  Administered 2021-09-13: 1 mg via INTRAMUSCULAR
  Filled 2021-09-13: qty 1

## 2021-09-13 NOTE — Telephone Encounter (Signed)
Prescription refill request for Eliquis received. ? ?Indication: afib  ?Last office visit: 02/24/2021, Oval Linsey  ?Scr: 1.01, 09/12/2021 ?Age: 88 yo  ?Weight: 78.3 kg  ? ?Refill sent.  ?

## 2021-09-13 NOTE — ED Notes (Signed)
Pt in bed sleeping no signs of distress.  

## 2021-09-13 NOTE — ED Notes (Signed)
Report received from [employer] Jenn.  ?

## 2021-09-13 NOTE — ED Notes (Signed)
Kayla Zuniga- [phone] ?He wants to be called with any updates on his mother ?He wants her seen by psychiatry and not just discharged ?She has been talking to demons and hallucinating, the police had to bring her in tonight because she took her car out and didn't know where she was ?
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
## 2021-09-13 NOTE — ED Provider Notes (Addendum)
88 year old female signed to me by Dr. Preston Fleeting pending evaluation by TTS.  Patient has history of A-fib and is on Eliquis this will as Cardizem.  Has had issues with having RVR and was treated with Lopressor 2.5 mg by Dr. Preston Fleeting.  Heart rate has been anywhere from 110-130.  Patient given additional dose of Lopressor here with minimal response.  Patient will receive 20 mg of Cardizem IV push.  She has been seen by TTS who has recommended inpatient hospitalization.  Family is trying to decide what they want to do ? ?2:50 PM ?Informing nursing that patient did not receive her Lopressor as originally stated.  Patient will be given 20 mg of IV diltiazem.  Patient is now agreeable to inpatient stay per recommendation by psychiatry.  Placement is being sought ? ?3:08 PM ?Patient reassessed after receiving Cardizem and has responded well to it.  She is now medically cleared.  Psychiatry will seek placement.  She has been restarted on her rate control medications for A-fib ?  ?Lorre Nick, MD ?09/13/21 1450 ? ?  ?Lorre Nick, MD ?09/13/21 1512 ? ?

## 2021-09-13 NOTE — ED Notes (Signed)
Pt holding for inpatient [facility] placement. Entered the pt's room to find brown urine in pure wick cannister. Updated BMP from this AM shows BUN and Creatinine within normal limits. EDP, Dr. Francia Greaves notified and aware. No further orders at this time. Will continue to monitor.  ?

## 2021-09-13 NOTE — ED Provider Notes (Signed)
Patient became agitated and aggressive.  She became verbally abusive to staff.  She removed her own IV.  She was attempting to elope from the facility. ? ?Verbal de-escalation was unsuccessful. ? ?She is unable to follow directions.  She is unsafe. ? ?IVC hold initiated. ? ?Medications administered to help control agitation. ?  ?Wynetta Fines, MD ?09/13/21 2003 ? ?

## 2021-09-13 NOTE — ED Notes (Signed)
Pt attached to cardiac monitor x3. Resting quietly with eyes closed. VSS. Purewick in place and hooked to suction.  ?
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
## 2021-09-13 NOTE — BH Assessment (Addendum)
@  1228, requested patients nurse Ladona Ridgel, RN) to place the TTS machine in patient's room if patient is ready to be evaluated. Awaiting response from nursing.  ? ?@1237 , patient's nurse , RN) will locate machine to set up for patient's TTS assessment.  ?

## 2021-09-13 NOTE — BH Assessment (Signed)
BHH Assessment Progress Note ?  ?Per Melbourne Abts, PA, this voluntary pt requires psychiatric hospitalization at this time.  [facility] will not be able to accommodate this pt today.  At the direction of Earlene Plater, MD this writer has sought placement for pt at facilities outside of the [facility] system. The following facilities have been contacted to seek placement for this pt, with results as noted: ?  ?Beds available, information sent, decision pending: ?[facility] ?[facility] ?[facility] ?[facility] ?  ?At capacity: ?[facility] [place] ?  ?  ?If this voluntary pt is accepted to a facility, please discuss disposition with pt to be sure that he agrees to the plan.  If a facility agrees to accept pt and the plan changes in any way please call the facility to inform them of the change.  Final disposition is pending as of this writing. ?  ?Doylene Canning, MA ?Behavioral Health Coordinator ?[phone]  ?

## 2021-09-13 NOTE — BH Assessment (Addendum)
BHH Assessment Progress Note ?  ?Per Caryn Bee, NP, this voluntary pt would benefit from psychiatric hospitalization at this time, but she does not meet criteria for IVC.  Pt and family would prefer for her to be discharged.  Pt is psychiatrically cleared.  Discharge instructions include referrals for local outpatient providers offering specialty care for geriatric patients.  EDP Lorre Nick, MD and pt's nurse, Ladona Ridgel, have been notified. ? ?Doylene Canning, MA ?Triage Specialist ?[phone]  ? ?Addendum: ? ?Pt and family have reportedly reconsidered options.  They would now like for pt to be hospitalized for psychiatric treatment.  This Clinical research associate will seek placement.  Parties noted above have been notified. ? ?Doylene Canning, MA ?Behavioral Health Coordinator ?[phone]  ?

## 2021-09-13 NOTE — Discharge Instructions (Signed)
For your behavioral health needs you are advised to follow up with an outpatient psychiatrist.  Contact one of the following providers at your earliest opportunity to schedule an intake appointment:       Gerald Plovsky, MD      [facility]      [address], [address]      [place], [place] [address]      [phone]       Vincent Izediuno, MD      [facility]      [address]      [place], [place] [address]      [phone]   

## 2021-09-13 NOTE — Progress Notes (Signed)
[facility] Inpatient Behavioral Health Placement  ? ?Pt meets inpatient criteria per Caryn Bee, NP. There are no appropriate beds at [facility] due to [facility] placement. And per  Elane Fritz, Do there are no available beds on [facility] unit. Referral was sent to the following facilities;  ? ?Destination ?Service Provider Address Phone Fax  ?[facility]  [address] [place], [place] [address] [phone]  ?[facility]  [address] [place] [address] [phone]  ?[facility]  [address] [place] [address] [phone]  ?[facility]  [address] [place], [place] [address] [phone]  ?[facility]  [address], [place] [address] [phone]  ?[facility]  [address] [place] [address] [phone]  ?[facility]  [address] [place] [place] [address] [phone]  ?[facility]  [address] [place] [address] [phone]  ?[facility]  [address], [place] [address] [phone]  ?[facility]  [address], [place] [address] [phone]  ?[facility]  [address] [place] [address] [phone]  ?[facility]      ? ? ?Situation ongoing,  CSW will follow up. ? ? ?Maryjean Ka, MSW, LCSWA ?09/13/2021  @ 10:07 PM ? ?

## 2021-09-14 DIAGNOSIS — F23 Brief psychotic disorder: Secondary | ICD-10-CM

## 2021-09-14 DIAGNOSIS — F29 Unspecified psychosis not due to a substance or known physiological condition: Secondary | ICD-10-CM | POA: Diagnosis present

## 2021-09-14 LAB — URINE CULTURE: Culture: NO GROWTH

## 2021-09-14 MED ORDER — HALOPERIDOL 1 MG PO TABS
2.0000 mg | ORAL_TABLET | Freq: Two times a day (BID) | ORAL | Status: DC
  Administered 2021-09-14 – 2021-09-15 (×3): 2 mg via ORAL
  Filled 2021-09-14 (×3): qty 2

## 2021-09-14 NOTE — ED Notes (Signed)
Patient to room 31. Patient calm and cooperative. Patient oriented to unit and room. Sitter at bedside.  ?

## 2021-09-14 NOTE — ED Notes (Signed)
Pt in bed sleeping no sign of distress.  ?

## 2021-09-14 NOTE — BH Assessment (Addendum)
BHH Assessment Progress Note ?  ?Per Caryn Bee, NP, this pt now requires psychiatric hospitalization at a facility providing specialty care for geriatric patients.  Overnight EDP Kristine Royal, MD found that pt meets criteria for IVC which he then initiated.  [facility] will not have a suitable bed available for this pt today, so this Clinical research associate has sought placement at facilities outside of the [facility] system.  The following facilities have been contacted to seek placement for this pt, with results as noted: ? ?Beds available, information sent, decision pending: ?[facility] (follow up call to yesterday's referral; left message with response pending) ?[facility] (follow up call to yesterday's referral; left message with response pending) ?[facility] ?[facility] ? ?Declined: ?[facility] (due to medical acuity) ? ?At capacity: ?[facility] ?[facility] ? ? ?Doylene Canning, MA ?Behavioral Health Coordinator ?[phone] ? ?
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
## 2021-09-14 NOTE — ED Notes (Addendum)
Pharmacy messaged in regards to sending Bystolic, Omega-3, and Avapro, all not currently available. EDP Freida Busman messaged about necessity of Cipro, as UA and culture results are back and culture showed no growth, awaiting response.  ?

## 2021-09-14 NOTE — ED Notes (Signed)
Son at bedside, patient is alert and oriented. Sitting up to eat breakfast. Able to swallow pills w/o difficulty.  ?

## 2021-09-15 NOTE — BH Assessment (Signed)
[facility] Assessment Progress Note ?  ?Per Caryn Bee, NP , this pt requires psychiatric hospitalization at this time.  Pt presents under IVC initiated by EDP Kristine Royal, MD.  At 08:39 Amber calls from [facility] to report that pt has been accepted to their facility by Dr Kevan Ny.  Fredna Dow concurs with this decision.  EDP Alvester Chou, MD and pt's nurse, Gabriel Earing, have been notified, and Taquita agrees to call report to [phone].  Pt is to be transported via [facility]. ? ?Doylene Canning ?Behavioral Health Coordinator ?[phone] ? ?

## 2021-09-15 NOTE — Progress Notes (Signed)
CSW called [facility] to follow up on referral. CSW informed that the referral needed to be resent. CSW resent the referral. ? ?Inpatient Behavioral Health  ? ?Pt meets inpatient criteria per Leigh Aurora, FNP. There are no appropriate bed at [facility]. Referral was sent to the following facilities;  ? ?Destination ?Service Provider Address Phone Fax  ?[facility]  [address] [place], [place] [address] [phone]  ?[facility]  [address] [place] [address] [phone]  ?[facility]  [address] [place] [address] [phone]  ?[facility]  [address] [place], [place] [address] [phone]  ?[facility]  [address] [place] [address] [phone]  ?[facility]  [address] [place] [address] [phone]  ?[facility]  [address] [place] [address] [phone]  ?[facility]  [address] [place] [address] [phone]  ?[facility]  [address] [place] [address] [phone]  ?[facility]  [address], [place] [address] [phone]  ?[facility]  [address] [place] [address] [phone]  ?[facility]  [address], [place] [address] [phone]  ?[facility]  [address] [place], [place] [address] [phone]  ?[facility]  [address] [place] [address] [phone]  ?[facility]  [address] [place] [address] [phone]  ?[facility]  [address]  [place] [address] [phone]  ?[facility]  [address] [place] [address] [phone]  ?[facility]  [address] [place] [address] [phone]  ? ? ?Situation ongoing,  CSW will follow up. ? ? ?Maryjean Ka, MSW, LCSWA ?09/15/2021  @ 12:22 AM ? ?

## 2021-09-15 NOTE — Progress Notes (Addendum)
Per Tresa Endo at [facility] pt is accepted PENDING labs and morning vitals signs. First shift will follow up. CSW provided phone to contact Doylene Canning. ? ?Nursing notified: Silvano Bilis, RN  ? ?Maryjean Ka, MSW, LCSWA ?09/15/2021 12:50 AM ? ? ?

## 2021-09-15 NOTE — ED Notes (Signed)
Pt woke up, used restroom independently, now sitting comfortably in recliner ?

## 2021-09-15 NOTE — ED Notes (Addendum)
Patient's son arrived to the TCU unit around 11 am and it is unknown how he was let back. I suspect he walked in the triage door when another person was coming through and did not check in like he should have.  Son was irate that the patient had already been picked up by [facility] sheriff's department for transfer to [facility] and that he was not contacted.  I was made aware of the situation by the nurse that was caring for the patient before she left for [facility]. I did get a couple of additional security officers to come with me to speak to him due to his level of anger reported to me. As I approached the son in the hall of TCU, he immediately made a statement to me that was threatening in nature, saying "yeah, you better get security back up because I am pissed of and she will be afraid of me." I would like to note that he was holding a metal cane in his hand when he made this statement to me.  I asked him to please clarify who exactly he was referring should be afraid of him, which he ignored the first time I asked, so I did have to ask a second time, to which he replied that "you" which mean he was in fact talking about me.  I made him aware that threatening staff was not necessary in any situation and that if he wanted to continue to talk that way, that we could end the conversation and he would be escorted out.  It also needs to be noted that the son still have on his visitor bracelet from the previous day with 18 on it and the patient had been moved from that room the day before. He also came back with the metal cane and cell phones.  According to the nurse that cared for the patient this morning she was much more oriented than she had been previously and she did not ask to call the son.  The son reported to me that the patient was not here under IVC but she actually was and had been since 09/13/21, which I did let him know and that because of this the patient nor him had any say in whether the patient  went to [facility] or not. He also reported to me more than once that he was the POA.  Upon reviewing the patient's chart, we do not have any POA paperwork. When I asked the son about POA paperwork, he stated he did not know where it was.  I made him aware we have strict HIPPA laws that we have to follow and that he really needs to bring the POA paperwork to place on file in the system because until he does than him saying that he is POA is just words, we have nothing to legally prove that he is actually his mother's POA.  His response was ok, so next time I'll just keep her home and let her die because I am not sure where the paperwork is. I educated that that certainly was not what we are saying but that the paperwork needs to be located as it is very important and something that if we had had on file, some of this could have been avoided. I did let him know that it was unfortunate that the psychiatric team that secure placement for her did not reach out to him but that that was something that had already passed and  I could not change that but that I apologize that it didn't happen. He was given the information, phone number, and address of where he mother was sent to as I do believe she would want him to know where she is considering she has been sent 4 hours away. Security then escorted the son out of the department. ?

## 2021-09-15 NOTE — ED Provider Notes (Signed)
Patient is here under IVC pending geri psych placement for hallucinations. ?Suspected to have possible UTI on admission, but urine cx from 09/13/21 is still no growth to date ?Also treated with diltiazem for A Fib with RVR - chronic issue - on A/C. ?Pt required sedation for agitation yesterday, attempting to elope ED ?This morning she is resting quietly, heart rate is under control ?Possible placement today at [facility] per SW note from midnight yesterday ?  ?Kayla Sleeper, MD ?09/15/21 [phone] ? ?

## 2021-09-15 NOTE — ED Notes (Signed)
Pt ambulated to the bed and is now asleep. ?

## 2021-10-06 ENCOUNTER — Telehealth: Payer: Self-pay | Admitting: Neurology

## 2021-10-06 ENCOUNTER — Encounter: Payer: Self-pay | Admitting: Neurology

## 2021-10-06 ENCOUNTER — Ambulatory Visit: Payer: Self-pay | Admitting: Neurology

## 2021-10-06 NOTE — Telephone Encounter (Signed)
Pt was a no show to the visit that is scheduled for today. ?

## 2021-10-11 ENCOUNTER — Encounter: Payer: Self-pay | Admitting: *Deleted

## 2021-10-12 ENCOUNTER — Encounter: Payer: Self-pay | Admitting: Neurology

## 2021-10-12 ENCOUNTER — Telehealth: Payer: Self-pay | Admitting: Neurology

## 2021-10-12 ENCOUNTER — Ambulatory Visit (INDEPENDENT_AMBULATORY_CARE_PROVIDER_SITE_OTHER): Payer: Medicare Other | Admitting: Neurology

## 2021-10-12 VITALS — BP 110/60 | HR 70 | Ht 60.0 in | Wt 165.2 lb

## 2021-10-12 DIAGNOSIS — R443 Hallucinations, unspecified: Secondary | ICD-10-CM | POA: Diagnosis not present

## 2021-10-12 DIAGNOSIS — R0683 Snoring: Secondary | ICD-10-CM

## 2021-10-12 DIAGNOSIS — F29 Unspecified psychosis not due to a substance or known physiological condition: Secondary | ICD-10-CM | POA: Diagnosis not present

## 2021-10-12 DIAGNOSIS — I482 Chronic atrial fibrillation, unspecified: Secondary | ICD-10-CM

## 2021-10-12 DIAGNOSIS — Z9289 Personal history of other medical treatment: Secondary | ICD-10-CM

## 2021-10-12 DIAGNOSIS — R413 Other amnesia: Secondary | ICD-10-CM | POA: Diagnosis not present

## 2021-10-12 NOTE — Patient Instructions (Addendum)
You have complaints of memory loss: memory loss or changes in cognitive function can have many reasons and does not always mean you have dementia. Conditions that can contribute to subjective or objective memory loss include: depression, stress, poor sleep from insomnia or sleep apnea, dehydration, fluctuation in blood sugar values, thyroid or electrolyte dysfunction and certain vitamin deficiencies. Dementia can be caused by stroke, brain atherosclerosis or brain vascular disease due to vascular risk factors (smoking, high blood pressure, high cholesterol, obesity and uncontrolled diabetes), certain degenerative brain disorders (including Parkinson's disease and Multiple sclerosis) and by Alzheimer's disease or other, more rare and sometimes hereditary causes. We will do some additional testing: we will do a brain scan. We will not start medication as yet. ?Based on your symptoms and your exam I believe you are at risk for obstructive sleep apnea (aka OSA). We should proceed with a sleep study to determine whether you do or do not have OSA and how severe it is. Even, if you have mild OSA, I may want you to consider treatment with CPAP, as treatment of even borderline or mild sleep apnea can result and improvement of symptoms such as sleep disruption, daytime sleepiness, nighttime bathroom breaks, restless leg symptoms, improvement of headache syndromes, even improved mood disorder.  ? ?As explained, an attended sleep study (meaning you get to stay overnight in the sleep lab), lets [place] monitor sleep-related behaviors such as sleep talking and leg movements in sleep, in addition to monitoring for sleep apnea.  A home sleep test is a screening tool for sleep apnea diagnosis only, but unfortunately, does not help with any other sleep-related diagnoses. ? ?Please remember, the long-term risks and ramifications of untreated moderate to severe obstructive sleep apnea may include (but are not limited to): increased risk for  cardiovascular disease, including congestive heart failure, stroke, difficult to control hypertension, treatment resistant obesity, arrhythmias, especially irregular heartbeat commonly known as A. Fib. (atrial fibrillation); even type 2 diabetes has been linked to untreated OSA.  ? ?For now, we will plan to follow-up after testing. ?Please also follow with your primary care as your thyroid function was not completely normal and your vitamin B12 level has been on the lower end.  Please try to hydrate well with water. ?I would recommend you no longer live alone and agree with you that you should no longer drive. ?

## 2021-10-12 NOTE — Telephone Encounter (Signed)
[facility] medicare order sent to [facility], [facility] they will reach out to the patient to schedule.  °

## 2021-10-12 NOTE — Progress Notes (Signed)
Subjective:  ?  ?Patient ID: Kayla Zuniga is a 88 y.o. female. ? ?HPI ? ? ? ?Kayla Foley, MD, PhD ?[facility] ?[address], [address] ?[id number] ?[place], [place] [address] ?Dear Kayla. Cliffton Asters,  ? ?I saw your patient, Kayla Zuniga, upon your kind request, in my Neurologic clinic today for initial consultation of her recent onset of hallucinations.  The patient is accompanied by her son Kayla Needle [facility]) today.  She missed an appointment on 10/06/2021.  As you know, Ms. Mellen is an 88 year old right-handed woman with an underlying medical history of hypertension, hyperlipidemia, atrial fibrillation, diabetes, retinopathy, macular degeneration, osteoporosis, vitamin B12 deficiency, hypothyroidism, and mild obesity, who reports feeling better.  Per son, she has a longer standing history of memory loss for at least months, she has had recent onset of hallucinations last month.  She denies any hallucinations, son endorses that she has been better since her hospitalization. I reviewed your office note from 09/12/2021.  Her son reported a 2-day history of altered mental status including auditory hallucinations.  She had blood work and urine testing through your office at the time and I reviewed the results from 09/12/2021: Urinalysis showed large leukocytes and positive nitrites.  She was found to have a UTI.  CMP showed elevated blood sugar of 125, BUN elevated at 27, creatinine 0.95, sodium and potassium normal, total bilirubin elevated at 1.4, CBC with differential showed a mildly low RBC of 3.91, MCV elevated at 102, MCH elevated at 34.1, B12 on the low side of the spectrum of 259.  TSH elevated at 5.78.  She was hospitalized in geriatric psychiatry. She had A-fib with RVR.  Hospital records are not available for my review today. ? ?Her Epworth sleepiness score is 12 out of 24, fatigue severity score is 34 out of 63. ? ?She reports no family history of dementia but her younger sister has some memory  issues, sister is 68 years old.  Father lived to be 19 and Mom lived to the 62.  She is divorced and has 1 son and 2 daughters, 1 daughter lives in [place] but is currently visiting, son lives in [place] and the other daughter lives in [place], for now, both daughters and taking care to stay with her overnight, they are looking into a long-term living facility such as memory care or assisted living for her son.  They are looking at [facility] in [place] or [place].  She is a non-smoker and does not drink alcohol, she does not drink caffeine on a daily basis.  She snores some per son.  She has never had a sleep study.  She has not driven a car since her hospitalization and reports that she will no longer drive, her son reports that she willingly gave it up.  She feels better after hospitalization, she is pending an outpatient psychiatry follow-up as I understand.  She was not started on any new medication, she was briefly treated for a urinary tract infection.  She also had blood work and urine testing which she was at [facility].  Her son provides her discharge instructions which we copied.  She was admitted on 09/15/2021 and discharged on 10/05/2021. ? ?She is taking oral vitamin B12. ? ?Her Past Medical History Is Significant For: ?Past Medical History:  ?Diagnosis Date  ? Afib (HCC) 2018  ? Kayla Ache, NP--> Kayla [facility]  ? Chronic kidney disease, stage 3 (HCC)   ? GFR 47 borderline elevated phosphorus,  Kayla Zuniga, no longer follows  ? Chronic urticaria   ? Diabetes mellitus without complication (HCC)   ? Essential hypertension 09/19/2017  ? Hypercholesterolemia   ? Hypertension   ? Hypothyroidism 10/2019  ? Macular degeneration   ? Osteoporosis   ? intolerant of biphosphates, on Prolia since 2019  ? Retinal hemorrhage   ? left eye, [facility]la [facility], 2017, improved with injections  ? Vitamin B12 deficiency   ? ? ?Her Past Surgical History Is Significant For: ?Past Surgical History:  ?Procedure  Laterality Date  ? BUNIONECTOMY    ? CARDIOVERSION N/A 04/13/2017  ? Procedure: CARDIOVERSION;  Surgeon: Kayla Nose, MD;  Location: [facility] ENDOSCOPY;  Service: Cardiovascular;  Laterality: N/A;  ? CATARACT EXTRACTION, BILATERAL  2016  ? Kayla Zuniga, 11/16 and 12/16  ? CHOLECYSTECTOMY  1973  ? DILATION AND CURETTAGE, DIAGNOSTIC / THERAPEUTIC  1980  ? TONSILLECTOMY    ? age 61  ? ? ?Her Family History Is Significant For: ?Family History  ?Problem Relation Age of Onset  ? Liver disease Sister   ? Heart disease Maternal Grandmother   ? Alzheimer's disease Neg Hx   ? Dementia Neg Hx   ? ? ?Her Social History Is Significant For: ?Social History  ? ?Socioeconomic History  ? Marital status: Single  ?  Spouse name: Not on file  ? Number of children: 3  ? Years of education: Not on file  ? Highest education level: Not on file  ?Occupational History  ? Not on file  ?Tobacco Use  ? Smoking status: Never  ? Smokeless tobacco: Never  ?Substance and Sexual Activity  ? Alcohol use: Yes  ?  Comment: rarely  ? Drug use: Not on file  ? Sexual activity: Not on file  ?Other Topics Concern  ? Not on file  ?Social History Narrative  ? Not on file  ? ?Social Determinants of Health  ? ?Financial Resource Strain: Not on file  ?Food Insecurity: Not on file  ?Transportation Needs: Not on file  ?Physical Activity: Not on file  ?Stress: Not on file  ?Social Connections: Not on file  ? ? ?Her Allergies Are:  ?Allergies  ?Allergen Reactions  ? Penicillins Hives and Itching  ? Sulfa Antibiotics Itching  ? Amlodipine Besylate Other (See Comments)  ?  Edema at higher doses  ? Atenolol Other (See Comments)  ? Fosamax [Alendronate] Other (See Comments)  ?  muscle/bone pain  ? Ibandronic Acid Other (See Comments)  ?  hallucinations  ? Prolia [Denosumab]   ?  Hallucinations/pill form only   ? Penicillin V Potassium Rash  ? Septra [Sulfamethoxazole-Trimethoprim] Rash  ? Sulfamethoxazole Rash  ?:  ? ?Her Current Medications Are:  ?Outpatient Encounter  Medications as of 10/12/2021  ?Medication Sig  ? apixaban (ELIQUIS) 5 MG TABS tablet TAKE 1 TABLET BY MOUTH  TWICE DAILY  ? atorvastatin (LIPITOR) 10 MG tablet Take 10 mg by mouth daily.  ? Cholecalciferol (VITAMIN D3) 3000 units TABS Take 3,000 Units by mouth daily.  ? ciprofloxacin (CIPRO) 500 MG tablet Take 500 mg by mouth 2 (two) times daily. For 5 days  ? Cyanocobalamin (B-12) 3000 MCG CAPS Take 3,000 mcg by mouth daily.  ? diltiazem (CARDIZEM CD) 120 MG 24 hr capsule Take 120 mg by mouth daily.  ? diltiazem (CARDIZEM CD) 240 MG 24 hr capsule Take 1 capsule (240 mg total) by mouth daily.  ? furosemide (LASIX) 20 MG tablet Take 1 tablet (20 mg total) by mouth  daily.  ? levothyroxine (SYNTHROID) 25 MCG tablet Take 25 mcg by mouth See admin instructions. 25mcg once daily 1 day each week, and take 50mcg once daily every other day.  ? metFORMIN (GLUCOPHAGE-XR) 500 MG 24 hr tablet Take 1,000 mg by mouth daily.  ? Multiple Vitamins-Minerals (ICAPS AREDS 2 PO) Take 1 tablet by mouth daily.  ? Multiple Vitamins-Minerals (MULTIVITAMIN WITH MINERALS) tablet Take 1 tablet by mouth daily.  ? nebivolol (BYSTOLIC) 10 MG tablet Take 10 mg by mouth daily.  ? Omega-3 Fatty Acids (FISH OIL) 1000 MG CAPS Take 1,000 mg by mouth daily.  ? potassium chloride SA (KLOR-CON) 20 MEQ tablet Take 40 mEq by mouth daily.  ? valsartan (DIOVAN) 320 MG tablet Take 1 tablet (320 mg total) by mouth daily.  ? vitamin C (ASCORBIC ACID) 250 MG tablet Take 250 mg by mouth daily.  ? ?No facility-administered encounter medications on file as of 10/12/2021.  ?: ? ? ?Review of Systems:  ?Out of a complete 14 point review of systems, all are reviewed and negative with the exception of these symptoms as listed below:  ? ?Review of Systems  ?Neurological:   ?     Pt is is for hallucinations.Son states that the  patient has hallucinations at night and some early am.Son states hallucinations have become better since family started to stay with  patient at night     ? ?Objective:  ?Neurological Exam ? ?Physical Exam ?Physical Examination:  ? ?Vitals:  ? 10/12/21 0756  ?BP: 110/60  ?Pulse: 70  ? ?General Examination: The patient is a very pleasant 88 y.o. female in no acut

## 2021-10-17 ENCOUNTER — Ambulatory Visit: Payer: Self-pay | Admitting: Neurology

## 2021-10-19 ENCOUNTER — Ambulatory Visit (INDEPENDENT_AMBULATORY_CARE_PROVIDER_SITE_OTHER): Payer: Medicare Other | Admitting: Neurology

## 2021-10-19 DIAGNOSIS — R413 Other amnesia: Secondary | ICD-10-CM

## 2021-10-19 DIAGNOSIS — R0683 Snoring: Secondary | ICD-10-CM

## 2021-10-19 DIAGNOSIS — G4720 Circadian rhythm sleep disorder, unspecified type: Secondary | ICD-10-CM

## 2021-10-19 DIAGNOSIS — Z9289 Personal history of other medical treatment: Secondary | ICD-10-CM

## 2021-10-19 DIAGNOSIS — R443 Hallucinations, unspecified: Secondary | ICD-10-CM

## 2021-10-19 DIAGNOSIS — F29 Unspecified psychosis not due to a substance or known physiological condition: Secondary | ICD-10-CM

## 2021-10-19 DIAGNOSIS — I482 Chronic atrial fibrillation, unspecified: Secondary | ICD-10-CM

## 2021-10-22 ENCOUNTER — Ambulatory Visit
Admission: RE | Admit: 2021-10-22 | Discharge: 2021-10-22 | Disposition: A | Discharge status: Home / Self Care | Payer: Medicare Other | Source: Ambulatory Visit | Attending: Neurology | Admitting: Neurology

## 2021-10-22 DIAGNOSIS — Z9289 Personal history of other medical treatment: Secondary | ICD-10-CM

## 2021-10-22 DIAGNOSIS — R443 Hallucinations, unspecified: Secondary | ICD-10-CM

## 2021-10-22 DIAGNOSIS — R0683 Snoring: Secondary | ICD-10-CM

## 2021-10-22 DIAGNOSIS — F29 Unspecified psychosis not due to a substance or known physiological condition: Secondary | ICD-10-CM

## 2021-10-22 DIAGNOSIS — R413 Other amnesia: Secondary | ICD-10-CM

## 2021-10-22 DIAGNOSIS — I482 Chronic atrial fibrillation, unspecified: Secondary | ICD-10-CM

## 2021-10-28 NOTE — Procedures (Signed)
PATIENT'S NAME:  Kayla Zuniga, Kayla Zuniga ?DOB:      [birth year]      ?MR#:    [id number]     ?DATE OF RECORDING: 10/19/2021 ?REFERRING M.D.:  Laurann Montana, MD ?Study Performed:   Baseline Polysomnogram ?HISTORY: 88 year old woman with a history of hypertension, hyperlipidemia, atrial fibrillation, diabetes, retinopathy, macular degeneration, osteoporosis, vitamin B12 deficiency, hypothyroidism, and mild obesity, who has memory loss. She is reported to snore and has daytime somnolence. The patient endorsed the Epworth Sleepiness Scale at 12 points. The patient's weight 165 pounds with a height of 60 (inches), resulting in a BMI of 32.5 kg/m2. The patient's neck circumference measured  inches. ? ?CURRENT MEDICATIONS: Eliquis, Lipitor, Vitamin D3, Cipro, Vitamin B12, Cardiazem CD, Lasix, Synthroid, Glucophage XR, Multivitamins w/minerals, Bystolic, fish oil, Klor-Con, Diovan, Vitamin C ?  ?PROCEDURE:  This is a multichannel digital polysomnogram utilizing the Somnostar 11.2 system.  Electrodes and sensors were applied and monitored per AASM Specifications.   EEG, EOG, Chin and Limb EMG, were sampled at 200 Hz.  ECG, Snore and Nasal Pressure, Thermal Airflow, Respiratory Effort, CPAP Flow and Pressure, Oximetry was sampled at 50 Hz. Digital video and audio were recorded.     ? ?BASELINE STUDY ? ?Lights Out was at 20:24 and Lights On at 04:59.  Total recording time (TRT) was 515 minutes, with a total sleep time (TST) of 464 minutes.   The patient's sleep latency to persistent sleep was 43.5 minutes, REM latency was 368 minutes, which is markedly delayed. The sleep efficiency was 90.1%.  ?   ?SLEEP ARCHITECTURE: WASO (Wake after sleep onset) was 33.5 minutes.  There were 9 minutes in Stage N1, 426 minutes Stage N2, 0 minutes Stage N3 and 29 minutes in Stage REM.  The percentage of Stage N1 was 1.9%, Stage N2 was 91.8%, which is markedly increased, Stage N3 was absent, and Stage R (REM sleep) was 6.25%, which is markedly reduced. The  arousals were noted as: 14 were spontaneous, 0 were associated with PLMs, 1 were associated with respiratory events. ? ?RESPIRATORY ANALYSIS:  There were a total of 28 respiratory events:  16 obstructive apneas, 0 central apneas and 1 mixed apneas with a total of 17 apneas and an apnea index (AI) of 2.2 /hour. There were 11 hypopneas with a hypopnea index of 1.4 /hour. The patient also had 0 respiratory event related arousals (RERAs).  ?    ?The total APNEA/HYPOPNEA INDEX (AHI) was 3.6/hour and the total RESPIRATORY DISTURBANCE INDEX was  3.6 /hour.  12 events occurred in REM sleep and 21 events in NREM. The REM AHI was  24.8 /hour, versus a non-REM AHI of 2.2. The patient spent 464 minutes of total sleep time in the supine position and 0 minutes in non-supine.. The supine AHI was 3.6 versus a non-supine AHI of 0.0. ? ?OXYGEN SATURATION & C02:  The Wake baseline 02 saturation was 95%, with the lowest being 78%. Time spent below 89% saturation equaled 83 minutes. ? ?PERIODIC LIMB MOVEMENTS: The patient had a total of 38 Periodic Limb Movements.  The Periodic Limb Movement (PLM) index was 4.9 and the PLM Arousal index was 0/hour. ? ?Audio and video analysis did not show any abnormal or unusual movements, behaviors, phonations or vocalizations. The patient took no bathroom breaks. Mild to moderate snoring was noted. The EKG was in keeping with normal sinus rhythm (NSR). ? ?Post-study, the patient indicated that sleep was the same as usual.  ? ?IMPRESSION: ? ?Primary Snoring ?Dysfunctions associated  with sleep stages or arousal from sleep ? ?RECOMMENDATIONS: ? ?This study does not demonstrate any significant obstructive or central sleep disordered breathing with the exception of snoring and REM sleep related OSA, with desaturations into the 80s and high 70s during supine REM sleep. While treatment with a CPAP or autoPAP machine is not indicated with a total AHI of less than 5/hour, weight loss and avoidance of the  supine sleep position may alleviate her stage-related OSA and her snoring. ?This study shows abnormal sleep stage percentages but no significant sleep fragmentation after patient achieved persistent sleep; these are nonspecific findings and per se do not signify an intrinsic sleep disorder or a cause for the patient's sleep-related symptoms. Causes include (but are not limited to) the first night effect of the sleep study, circadian rhythm disturbances, medication effect or an underlying mood disorder or medical problem.  ?The patient should be cautioned not to drive, work at heights, or operate dangerous or heavy equipment when tired or sleepy. Review and reiteration of good sleep hygiene measures should be pursued with any patient. ?The patient will be seen in follow-up at [facility] for discussion of the test results, symptom and treatment compliance review, further management strategies, etc. The referring provider will be notified of the test results. ? ?I certify that I have reviewed the entire raw data recording prior to the issuance of this report in accordance with the Standards of Accreditation of the American Academy of Sleep Medicine (AASM) ? ?Huston Foley, MD, PhD ?Diplomat, American Board of Neurology and Sleep Medicine (Neurology and Sleep Medicine) ? ? ? ?

## 2021-10-31 ENCOUNTER — Telehealth: Payer: Self-pay

## 2021-10-31 NOTE — Telephone Encounter (Signed)
-----   Message from Star Age, MD sent at 10/28/2021 12:30 PM EDT ----- ?Patient referred by Dr. Dema Severin for memory loss and AMS, seen by me on 10/12/21, diagnostic PSG on 10/19/21.   ?Please call and notify the patient's son (on [place]) that the recent sleep study did not show any significant obstructive sleep apnea with the exception of snoring and REM sleep related OSA, with desaturations into the 80s and high 70s during supine REM sleep. While treatment with a CPAP or autoPAP machine is not indicated with a total AHI of less than 5/hour, weight loss and avoidance of the supine sleep position may alleviate her stage-related OSA and her snoring. Please also inform him that patient's recent brain MRI showed volume loss, which we call atrophy. This finding is not specific but is often seen in patients with memory loss and dementia.  ?I believe, she is transitioning into an ALF or memory care facility. We can plan a FU in about 3 mo.  ? ?Thanks, ? ?Star Age, MD, PhD ?[facility]) ? ?

## 2021-10-31 NOTE — Telephone Encounter (Signed)
I called patient. I spoke with patient's son, Casimiro Needle, per [facility]. I discussed patient's sleep study and MRI results. Patient's son reports that for financial reasons they are trying to keep patient at home rather than transition her to an ALF or memory care facility. He is agreeable to a follow up in 3 months and this appointment was scheduled for 02/07/2022 at 1:15pm. Patient's son verbalized understanding of results and had no further questions or concerns. ?

## 2021-12-04 ENCOUNTER — Other Ambulatory Visit: Payer: Self-pay

## 2021-12-04 ENCOUNTER — Emergency Department (HOSPITAL_COMMUNITY): Payer: Medicare Other

## 2021-12-04 ENCOUNTER — Emergency Department (HOSPITAL_COMMUNITY)
Admission: EM | Admit: 2021-12-04 | Discharge: 2021-12-04 | Disposition: A | Discharge status: Home / Self Care | Payer: Medicare Other | Attending: Emergency Medicine | Admitting: Emergency Medicine

## 2021-12-04 ENCOUNTER — Encounter (HOSPITAL_COMMUNITY): Payer: Self-pay

## 2021-12-04 DIAGNOSIS — M545 Low back pain, unspecified: Secondary | ICD-10-CM | POA: Diagnosis present

## 2021-12-04 DIAGNOSIS — Z7901 Long term (current) use of anticoagulants: Secondary | ICD-10-CM | POA: Insufficient documentation

## 2021-12-04 DIAGNOSIS — I10 Essential (primary) hypertension: Secondary | ICD-10-CM | POA: Insufficient documentation

## 2021-12-04 DIAGNOSIS — E119 Type 2 diabetes mellitus without complications: Secondary | ICD-10-CM | POA: Insufficient documentation

## 2021-12-04 DIAGNOSIS — Z79899 Other long term (current) drug therapy: Secondary | ICD-10-CM | POA: Diagnosis not present

## 2021-12-04 DIAGNOSIS — R109 Unspecified abdominal pain: Secondary | ICD-10-CM | POA: Diagnosis not present

## 2021-12-04 DIAGNOSIS — R531 Weakness: Secondary | ICD-10-CM | POA: Diagnosis not present

## 2021-12-04 DIAGNOSIS — I4891 Unspecified atrial fibrillation: Secondary | ICD-10-CM | POA: Insufficient documentation

## 2021-12-04 LAB — URINALYSIS, ROUTINE W REFLEX MICROSCOPIC
Bacteria, UA: NONE SEEN
Bilirubin Urine: NEGATIVE
Glucose, UA: NEGATIVE mg/dL
Hgb urine dipstick: NEGATIVE
Ketones, ur: NEGATIVE mg/dL
Nitrite: NEGATIVE
Protein, ur: NEGATIVE mg/dL
Specific Gravity, Urine: 1.006 (ref 1.005–1.030)
pH: 5 (ref 5.0–8.0)

## 2021-12-04 LAB — CBC WITH DIFFERENTIAL/PLATELET
Abs Immature Granulocytes: 0.06 10*3/uL (ref 0.00–0.07)
Basophils Absolute: 0 10*3/uL (ref 0.0–0.1)
Basophils Relative: 0 %
Eosinophils Absolute: 0 10*3/uL (ref 0.0–0.5)
Eosinophils Relative: 0 %
HCT: 37.7 % (ref 36.0–46.0)
Hemoglobin: 12.9 g/dL (ref 12.0–15.0)
Immature Granulocytes: 1 %
Lymphocytes Relative: 7 %
Lymphs Abs: 0.8 10*3/uL (ref 0.7–4.0)
MCH: 35.5 pg — ABNORMAL HIGH (ref 26.0–34.0)
MCHC: 34.2 g/dL (ref 30.0–36.0)
MCV: 103.9 fL — ABNORMAL HIGH (ref 80.0–100.0)
Monocytes Absolute: 0.8 10*3/uL (ref 0.1–1.0)
Monocytes Relative: 7 %
Neutro Abs: 9.9 10*3/uL — ABNORMAL HIGH (ref 1.7–7.7)
Neutrophils Relative %: 85 %
Platelets: 187 10*3/uL (ref 150–400)
RBC: 3.63 MIL/uL — ABNORMAL LOW (ref 3.87–5.11)
RDW: 13.2 % (ref 11.5–15.5)
WBC: 11.6 10*3/uL — ABNORMAL HIGH (ref 4.0–10.5)
nRBC: 0 % (ref 0.0–0.2)

## 2021-12-04 LAB — BASIC METABOLIC PANEL
Anion gap: 9 (ref 5–15)
BUN: 24 mg/dL — ABNORMAL HIGH (ref 8–23)
CO2: 24 mmol/L (ref 22–32)
Calcium: 9 mg/dL (ref 8.9–10.3)
Chloride: 107 mmol/L (ref 98–111)
Creatinine, Ser: 0.74 mg/dL (ref 0.44–1.00)
GFR, Estimated: >60 mL/min (ref >60)
Glucose, Bld: 174 mg/dL — ABNORMAL HIGH (ref 70–99)
Potassium: 4.6 mmol/L (ref 3.5–5.1)
Sodium: 140 mmol/L (ref 135–145)

## 2021-12-04 LAB — CBG MONITORING, ED: Glucose-Capillary: 208 mg/dL — ABNORMAL HIGH (ref 70–99)

## 2021-12-04 IMAGING — MR MR THORACIC SPINE W/O CM
6 series · 42 of 48 positions shown · non-contrast
Comparison: None Available.

CLINICAL DATA: Low back pain

EXAM:
MRI THORACIC SPINE WITHOUT CONTRAST
TECHNIQUE: Multiplanar, multisequence MR imaging of the thoracic spine was
performed. No intravenous contrast was administered.

[Series 16: T1 · sagittal · 4.0mm · 1.72mm/px · 7 of 15 slices shown (1 of 2)]
[im 1/15]
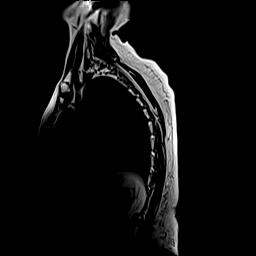
[im 3/15]
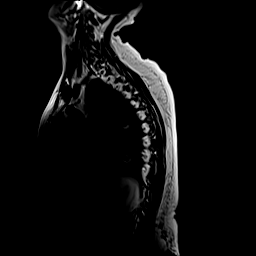
[im 5/15]
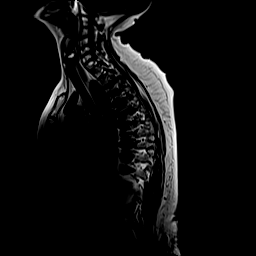
[im 8/15]
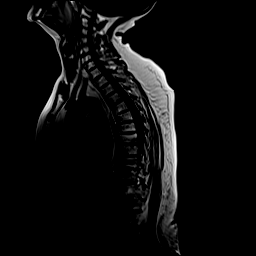
[im 10/15]
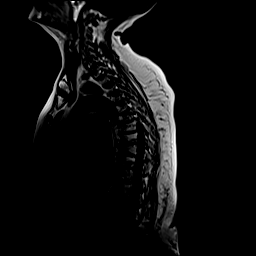
[im 12/15]
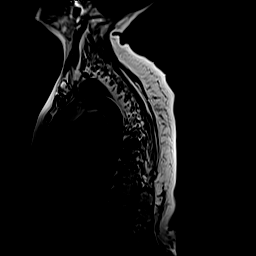
[im 15/15]
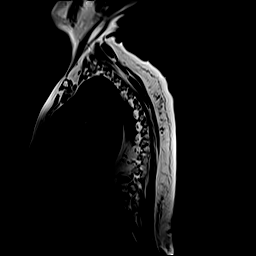

[Series 17: STIR · sagittal · 3.0mm · 1.00mm/px · 8 of 20 slices shown]
[im 1/20]
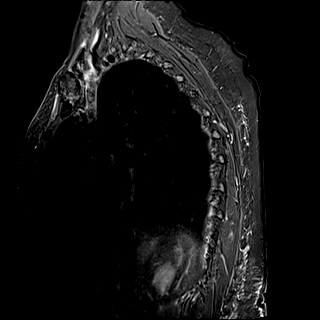
[im 3/20]
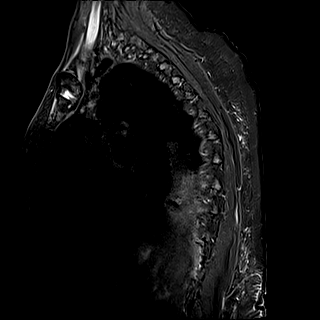
[im 5/20]
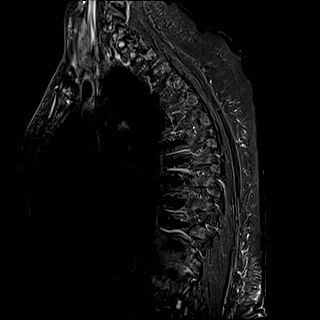
[im 8/20]
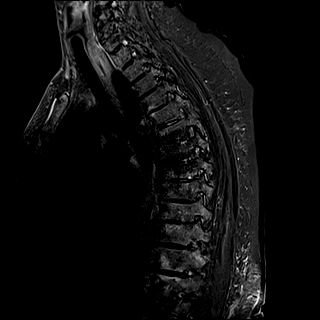
[im 12/20]
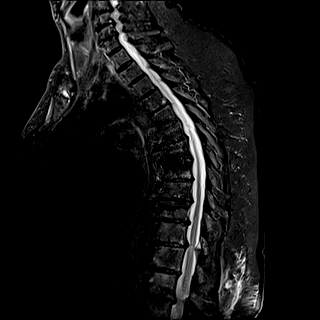
[im 15/20]
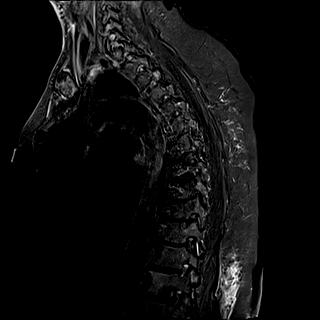
[im 17/20]
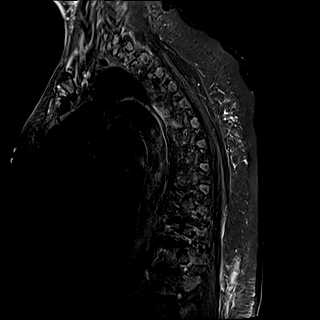
[im 20/20]
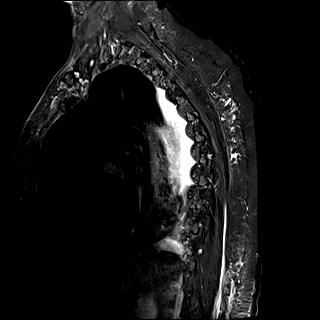

[Series 18: T1 · sagittal · 3.0mm · 1.00mm/px · 9 of 20 slices shown (2 of 2)]
[im 1/20]
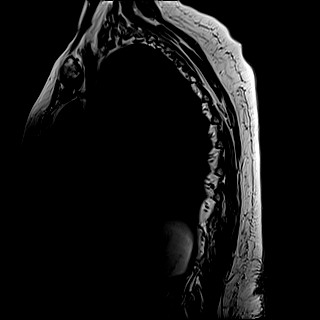
[im 3/20]
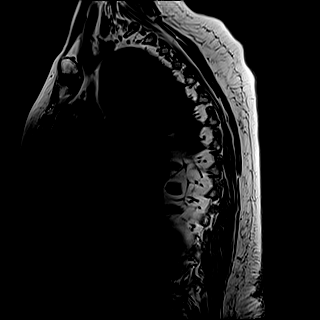
[im 5/20]
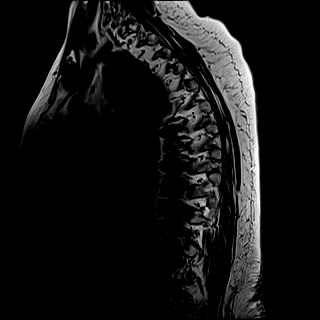
[im 8/20]
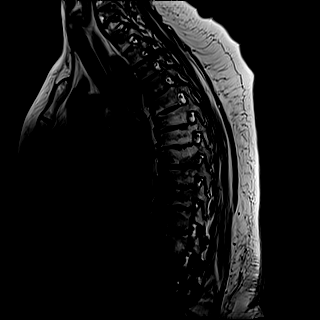
[im 10/20]
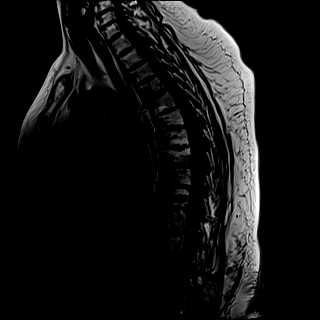
[im 12/20]
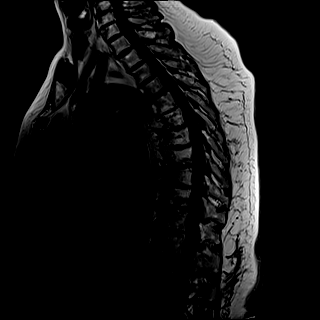
[im 15/20]
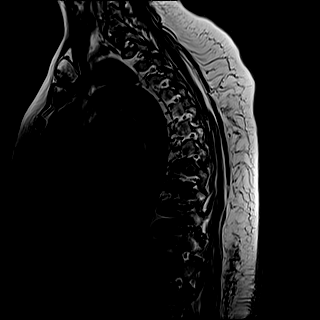
[im 17/20]
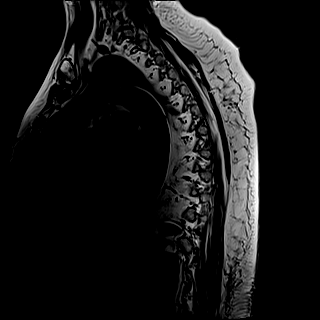
[im 20/20]
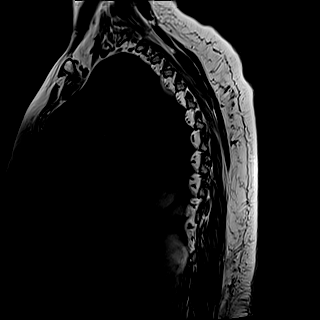

[Series 19: T2 · sagittal · 3.0mm · 0.83mm/px · 9 of 20 slices shown (1 of 2)]
[im 1/20]
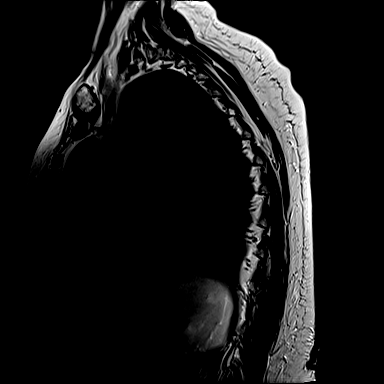
[im 3/20]
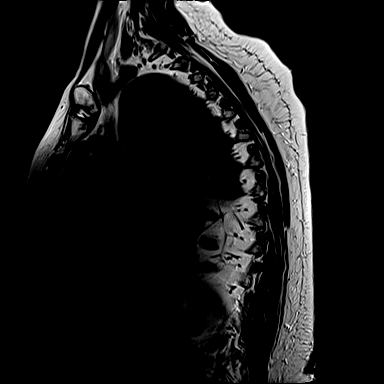
[im 5/20]
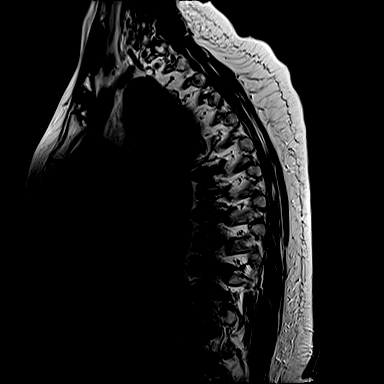
[im 8/20]
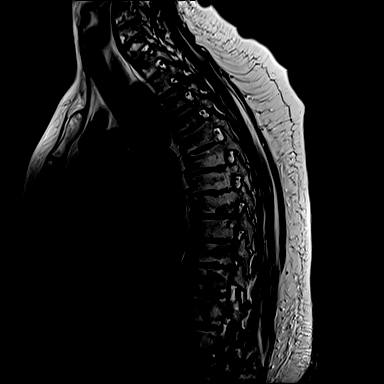
[im 10/20]
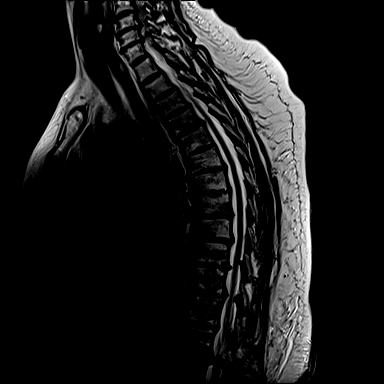
[im 12/20]
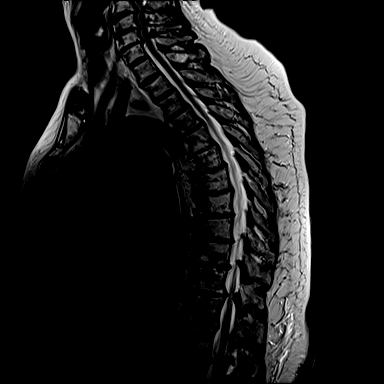
[im 15/20]
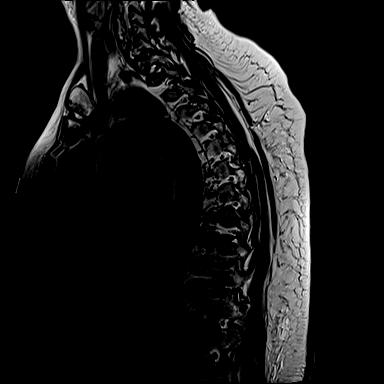
[im 17/20]
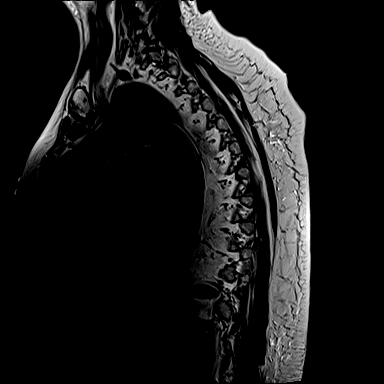
[im 20/20]
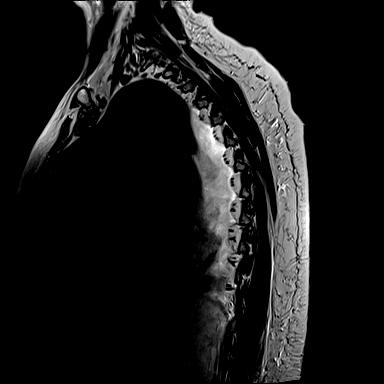

[Series 20: T2 · axial · 4.0mm · 0.78mm/px · z∈[-170,-40]mm · 7 of 15 slices shown (2 of 2)]
[im 1/15]
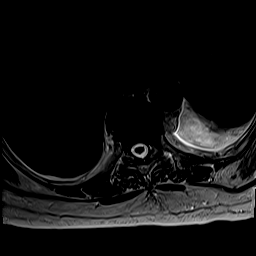
[im 3/15]
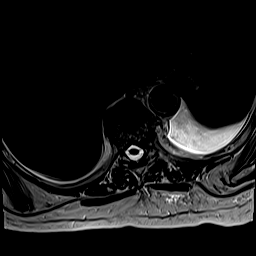
[im 5/15]
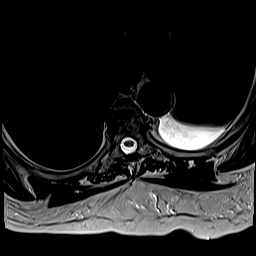
[im 8/15]
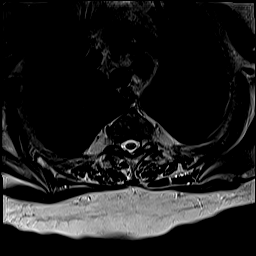
[im 10/15]
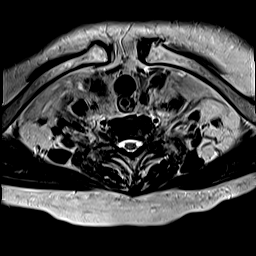
[im 12/15]
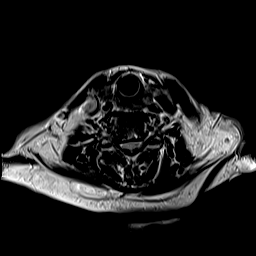
[im 15/15]
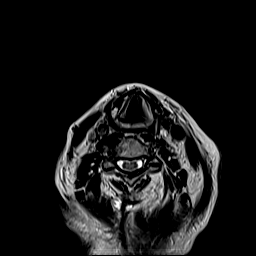

[Series 21: t2_me2d_tra · axial · 4.0mm · 0.39mm/px · z∈[-170,-155]mm · 2 of 15 slices shown]
[im 1/15]
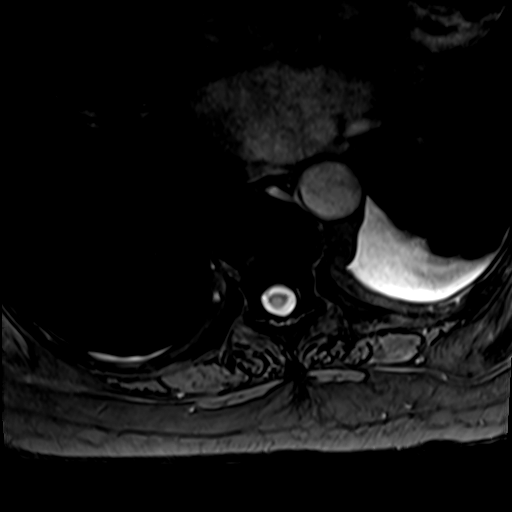
[im 3/15]
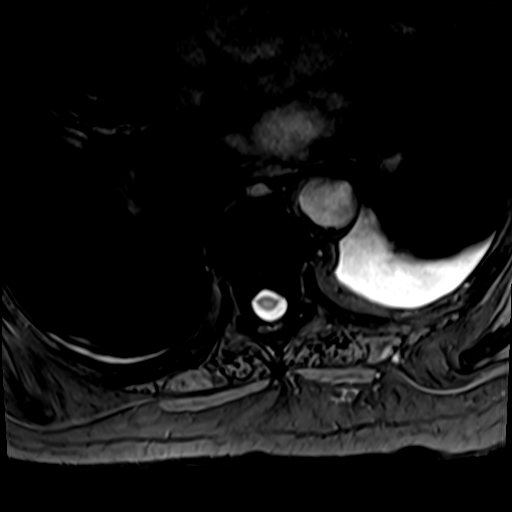

[42 of 48 positions shown; findings below may reference images not displayed]

FINDINGS: Alignment: Mildly exaggerated thoracic kyphosis. No static
listhesis.

Vertebrae: No fracture, evidence of discitis, or bone lesion.
Multilevel discogenic endplate marrow changes. Scattered small
endplate Schmorl's nodes.

Cord: Cord is focally diminutive with subtly increased T2/STIR
signal at the T11-12 level (series 17, image 12). Thoracic cord
appears otherwise within normal limits. There is canal stenosis with
impress upon the cervical cord at the C4-5, C5-6, and C6-7 levels
seen at the edge of the field of view (series 17, image 10).

Paraspinal and other soft tissues: Small layering left pleural
effusion. Degenerative arthropathy of the bilateral sternoclavicular
joints.

Disc levels:

Multilevel intervertebral disc height loss with multiple small disc
bulges and endplate spurring throughout the thoracic spine. Findings
most notably involve the T11-12 level where disc bulge and posterior
element hypertrophy contribute to at least moderate canal stenosis.
Mild canal stenosis at T10-11. No additional sites of canal stenosis
within the thoracic spine. Left-sided foraminal stenosis at T9-10
through T11-T12.
IMPRESSION: 1. Multilevel degenerative changes of the thoracic spine, most
pronounced at the T11-12 level where there is at least moderate
canal stenosis and left-sided foraminal stenosis.
2. Focally diminutive appearance of the thoracic spinal cord at the
T11-12 level with subtly increased cord signal suggesting chronic
compressive myelomalacia.
3. Partially visualized cervical spondylosis with canal stenosis at
the C4-5, C5-6, and C6-7 levels seen at the edge of the field of
view. This could be further characterized with dedicated cervical
spine MRI, as indicated by patient's symptoms.
4. Small layering left pleural effusion.

## 2021-12-04 IMAGING — MR MR LUMBAR SPINE W/O CM
4 of 5 series · 30 of 48 positions shown · non-contrast
Comparison: CT [DATE]

CLINICAL DATA: Low back pain, cauda equina syndrome suspected

EXAM:
MRI LUMBAR SPINE WITHOUT CONTRAST
TECHNIQUE: Multiplanar, multisequence MR imaging of the lumbar spine was
performed. No intravenous contrast was administered.

[Series 5: T1 · sagittal · 4.0mm · 0.81mm/px · 6 of 17 slices shown (1 of 2)]
[im 1/17]
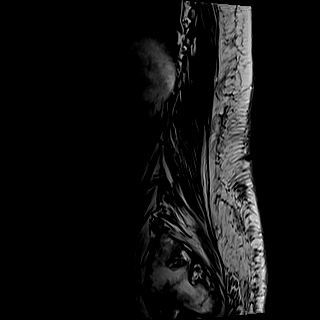
[im 4/17]
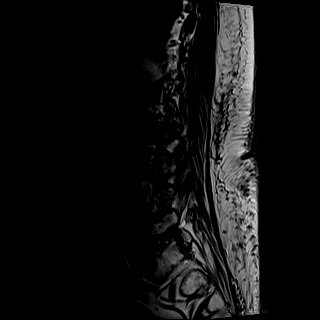
[im 7/17]
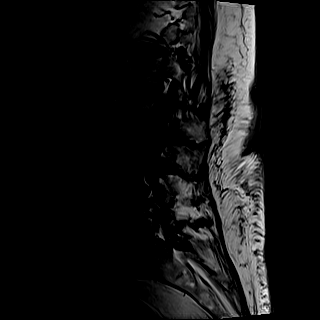
[im 10/17]
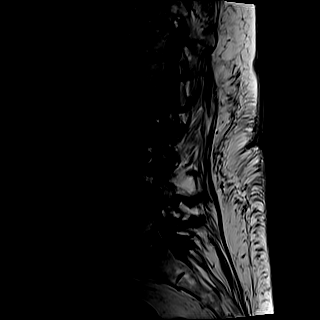
[im 13/17]
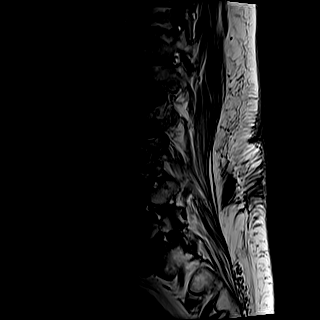
[im 17/17]
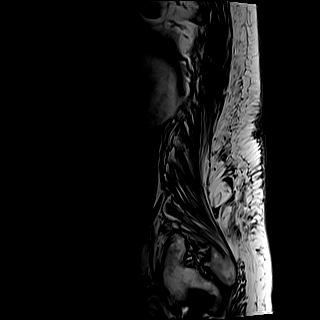

[Series 6: T2 · sagittal · 4.0mm · 0.81mm/px · 6 of 17 slices shown (1 of 2)]
[im 1/17]
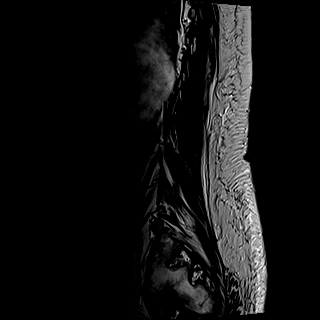
[im 4/17]
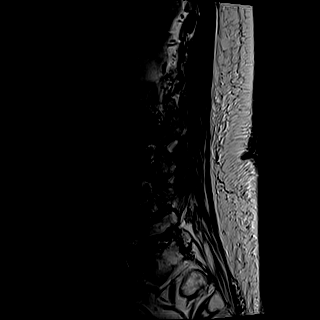
[im 7/17]
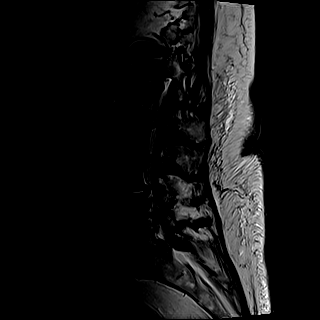
[im 10/17]
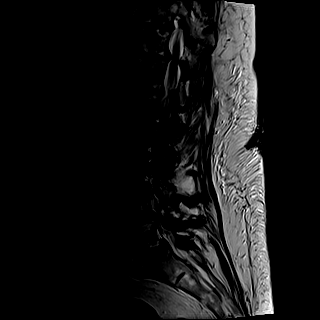
[im 13/17]
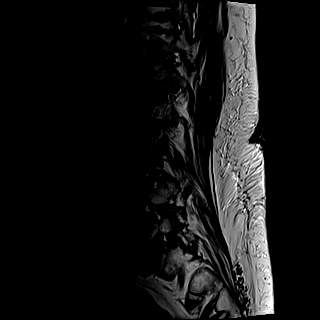
[im 17/17]
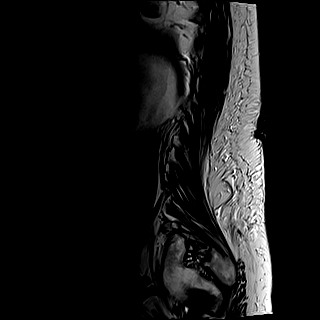

[Series 8: T2 · axial · 4.0mm · 0.62mm/px · z∈[-395,-201]mm · 9 of 40 slices shown (2 of 2)]
[im 1/40]
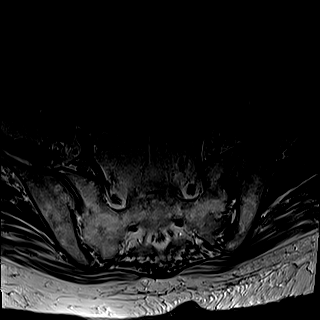
[im 6/40]
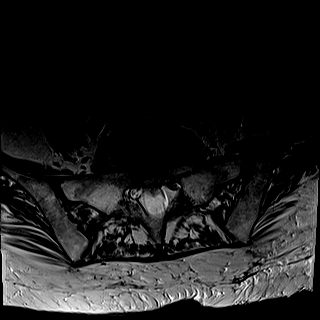
[im 12/40]
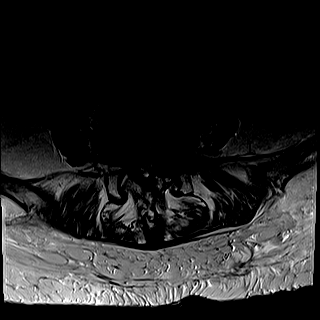
[im 17/40]
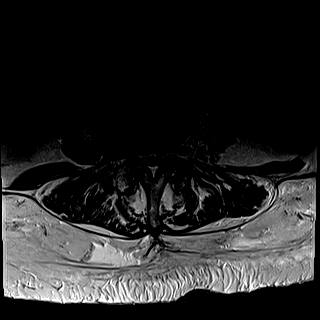
[im 20/40]
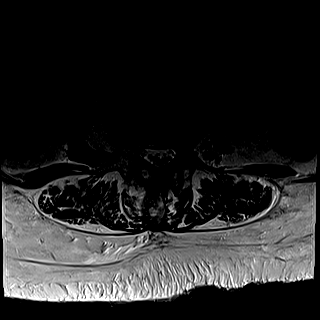
[im 23/40]
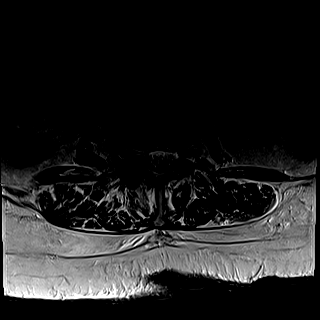
[im 28/40]
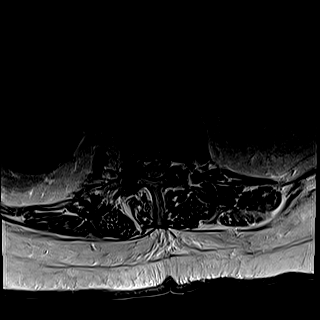
[im 34/40]
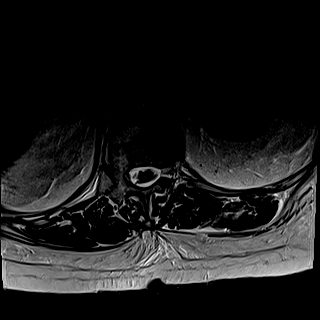
[im 40/40]
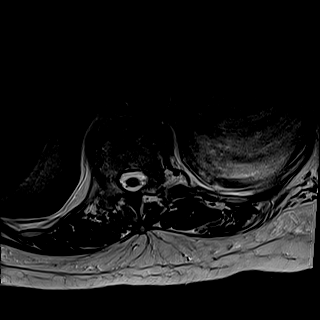

[Series 9: T1 · axial · 4.0mm · 0.39mm/px · z∈[-395,-201]mm · 9 of 40 slices shown (2 of 2)]
[im 1/40]
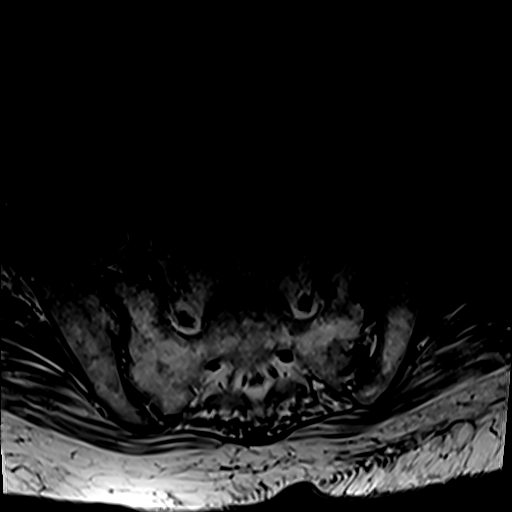
[im 6/40]
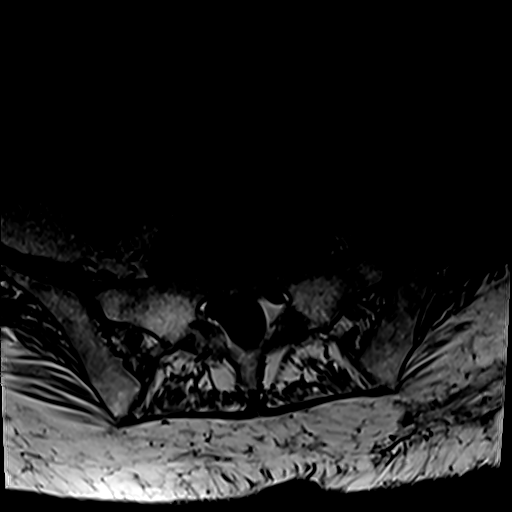
[im 12/40]
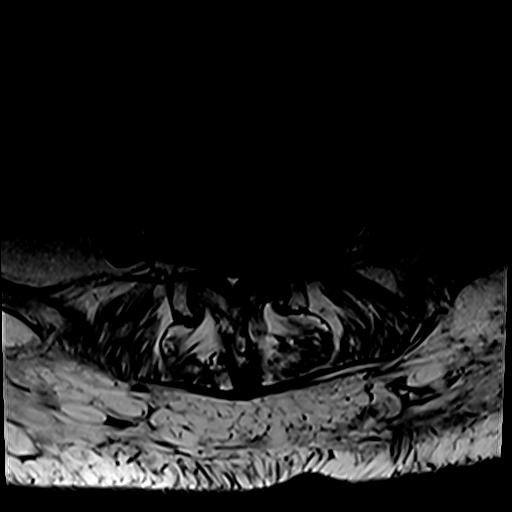
[im 17/40]
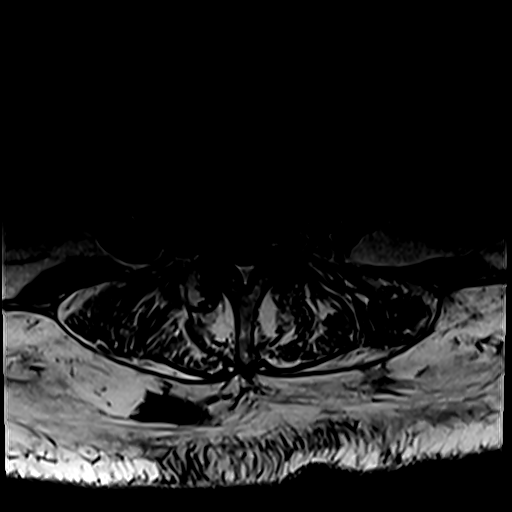
[im 20/40]
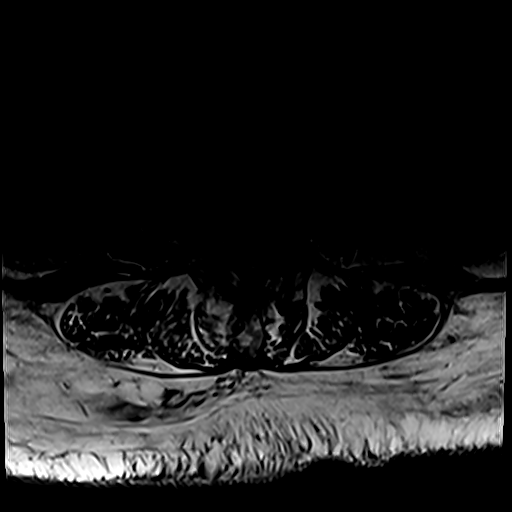
[im 23/40]
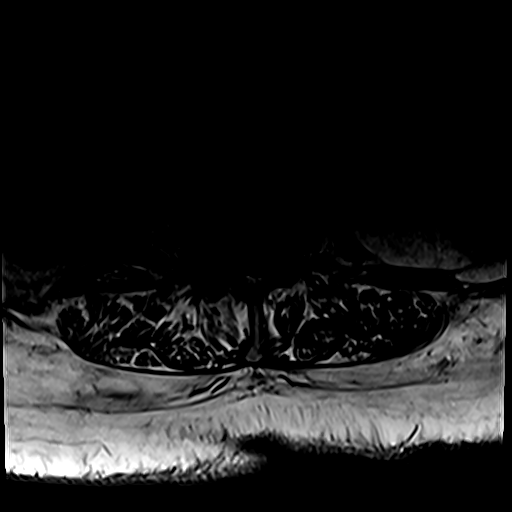
[im 28/40]
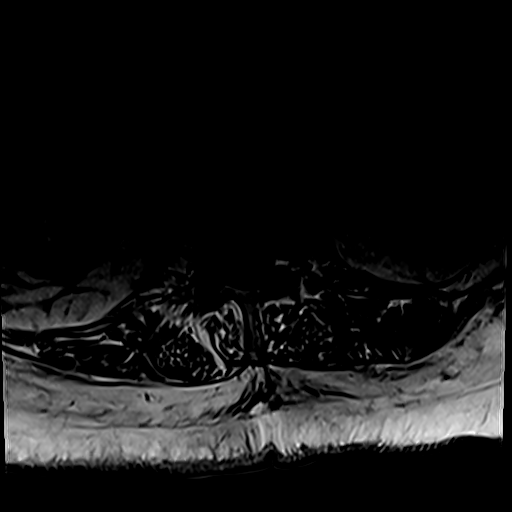
[im 34/40]
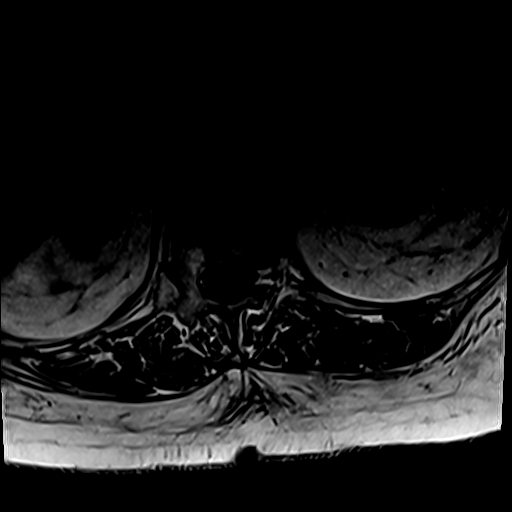
[im 40/40]
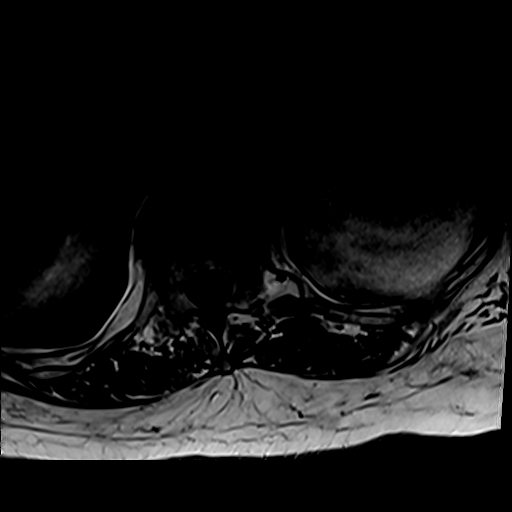

[30 of 48 positions shown; findings below may reference images not displayed]

FINDINGS: Segmentation:  Standard.

Alignment: Mild lumbar levocurvature. Grade 1 anterolisthesis of L4
on L5 and L5 on S1.

Vertebrae: No fracture, evidence of discitis, or bone lesion.
Multilevel discogenic endplate marrow changes.

Conus medullaris and cauda equina: Conus extends to the L1-2 level.
Slight bunching of the cauda equina nerve roots above the levels of
maximal stenosis. Subtly increased T2/STIR signal within the lower
thoracic cord at T11-T12. Cord is minimally diminutive in caliber at
this location.

Paraspinal and other soft tissues: Negative.

Disc levels:

T12-L1: Disc height loss with mild disc bulge, slightly eccentric to
the right. Mild bilateral facet arthropathy. Mild canal stenosis
with mild right foraminal stenosis.

L1-L2: Disc height loss with endplate ridging and mild disc bulge.
Moderate right and mild left facet arthropathy with ligamentum
flavum buckling. Moderate canal stenosis with severe right and mild
left foraminal stenosis.

L2-L3: Disc height loss with mild shallow central disc protrusion
and endplate ridging. Moderate bilateral facet arthropathy with
ligamentum flavum buckling. Moderate canal stenosis with moderate to
severe right and mild left foraminal stenosis.

L3-L4: Disc height loss with endplate ridging and mild disc bulge.
Moderate-severe bilateral facet arthropathy with ligamentum flavum
buckling. Moderate canal stenosis with severe right and
moderate-severe left foraminal stenosis.

L4-L5: Mild disc uncovering with diffuse disc bulge and endplate
spurring. Moderate-severe bilateral facet arthropathy with
ligamentum flavum buckling. Findings result in severe canal stenosis
with moderate-severe left and mild right foraminal stenosis.

L5-S1: Anterolisthesis with disc uncovering and mild diffuse disc
bulge. Moderate-severe bilateral facet arthropathy. Moderate
bilateral foraminal stenosis. No significant canal stenosis.
IMPRESSION: 1. Advanced multilevel degenerative changes of the lumbar spine.
Findings are most pronounced at the L4-5 level where there is severe
canal stenosis and moderate-severe left foraminal stenosis.
2. Moderate canal stenosis at the L1-2, L2-3, and L3-4 levels.
3. Severe right foraminal stenosis at L1-2 and L2-3.
4. Stenosis within the lower thoracic spine, as described on
dedicated thoracic spine MR.

## 2021-12-04 IMAGING — CT CT RENAL STONE PROTOCOL
2 of 4 series · 15 of 46 positions shown, 17 images · non-contrast
Comparison: None Available.

CLINICAL DATA: Back pain.  Kidney stone suspected.



[Series 2: axial st · axial · 0.98mm/px · z∈[+1174,+1549]mm · 12 of 89 slices shown, 14 images]
[im 7/89  soft-tissue]
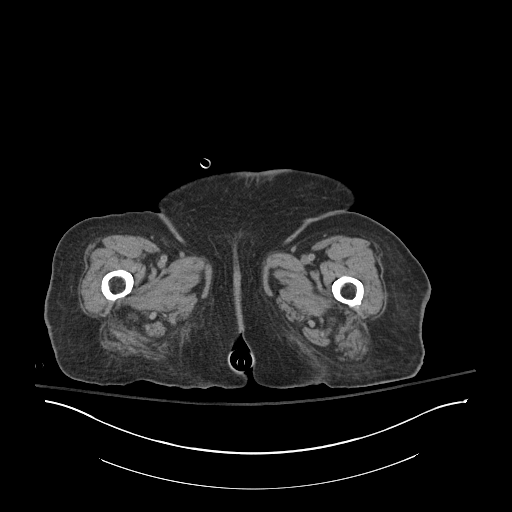
[im 7/89  bone]
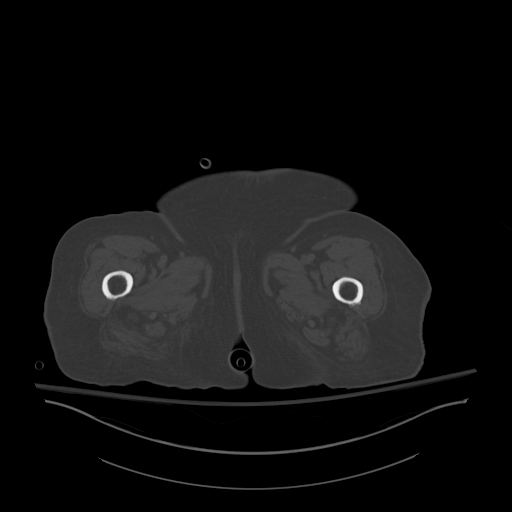
[im 14/89  soft-tissue]
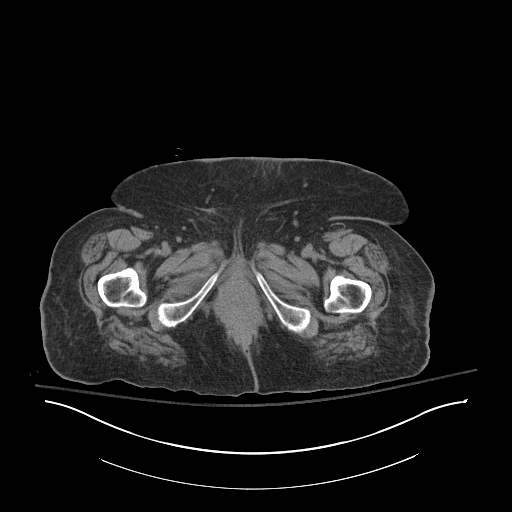
[im 21/89  soft-tissue]
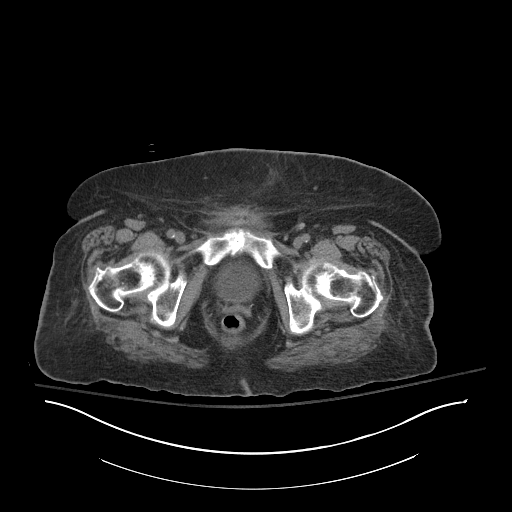
[im 28/89  soft-tissue]
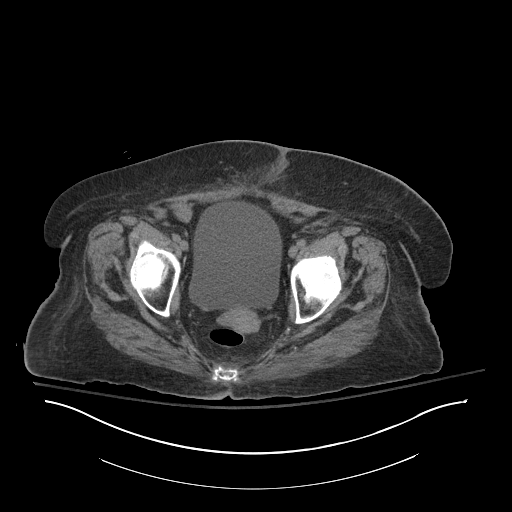
[im 34/89  soft-tissue]
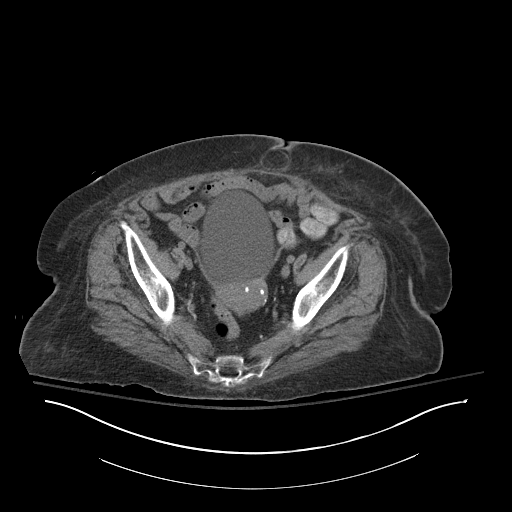
[im 41/89  soft-tissue]
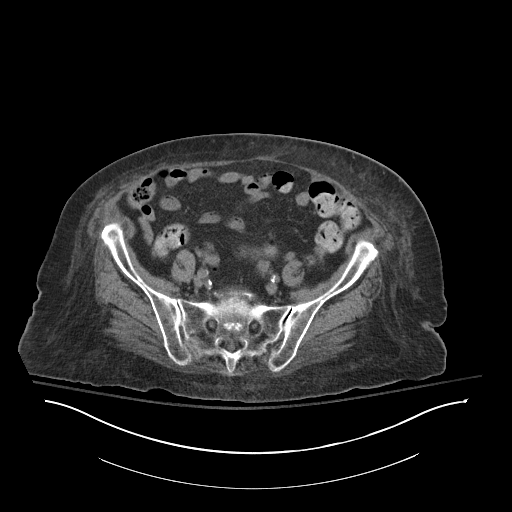
[im 48/89  soft-tissue]
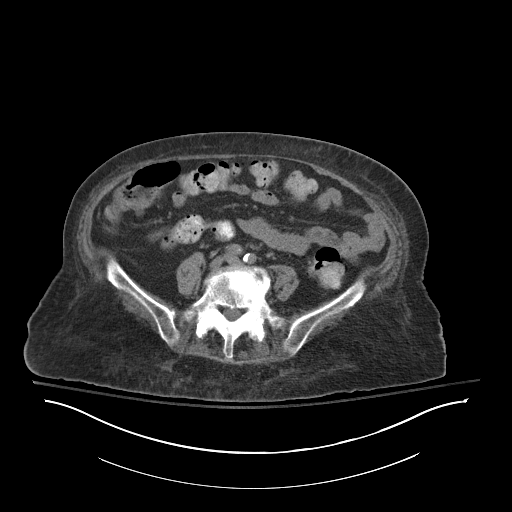
[im 55/89  soft-tissue]
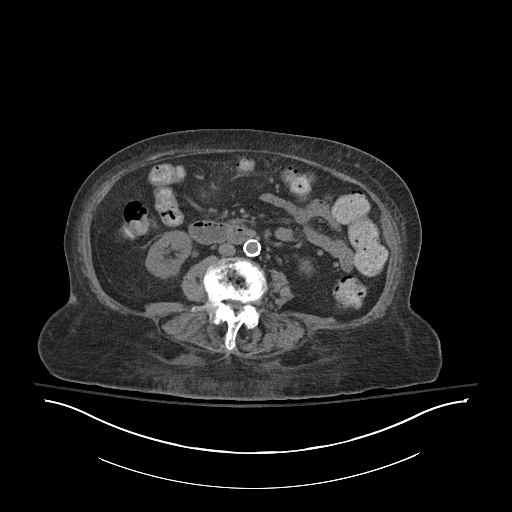
[im 61/89  soft-tissue]
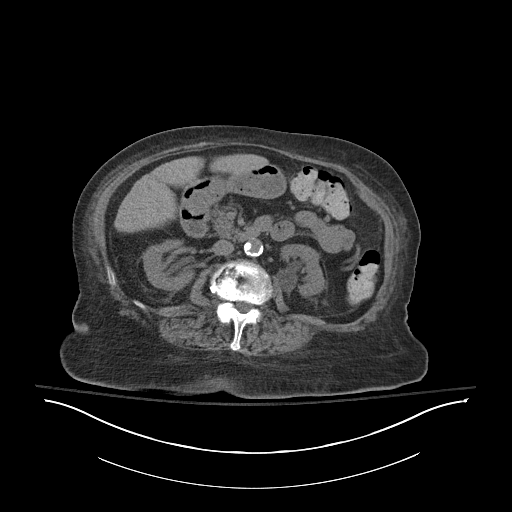
[im 61/89  bone]
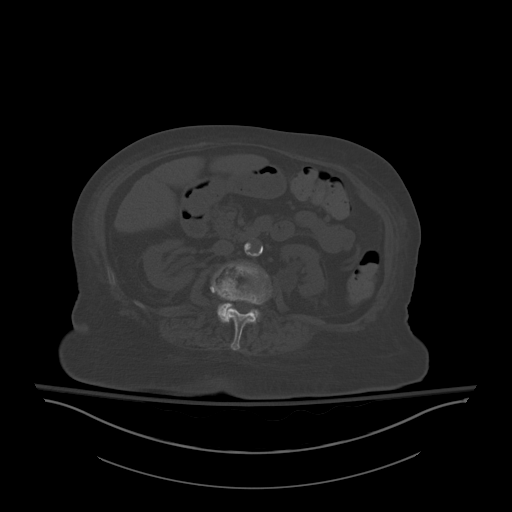
[im 68/89  soft-tissue]
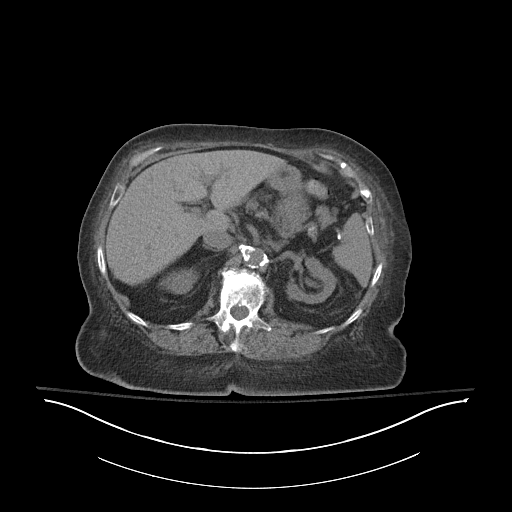
[im 75/89  soft-tissue]
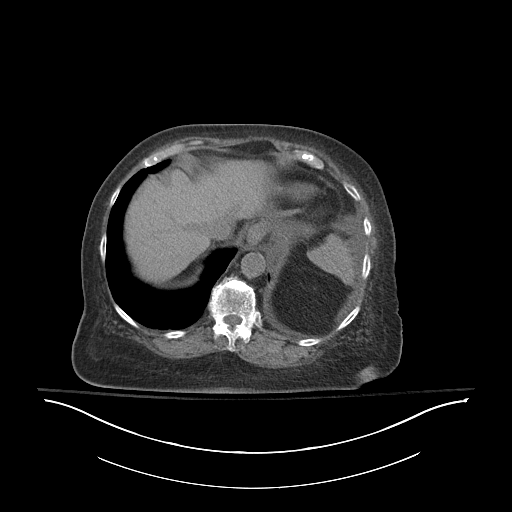
[im 82/89  soft-tissue]
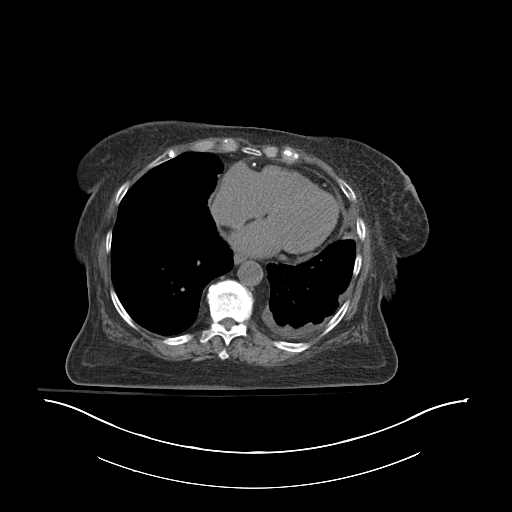

[Series 4: coronal · coronal · 0.83mm/px · 3 of 147 slices shown]
[im 49/147  soft-tissue]
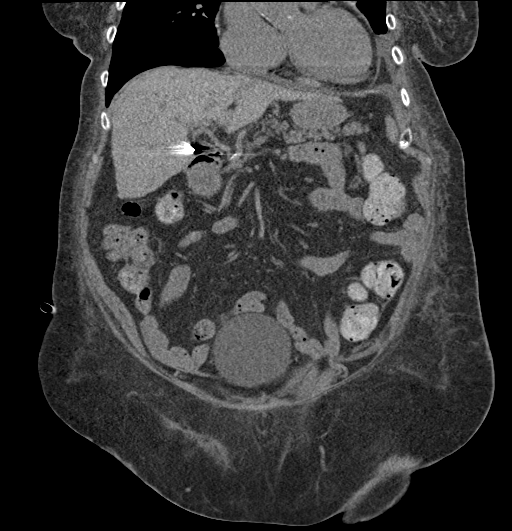
[im 65/147  soft-tissue]
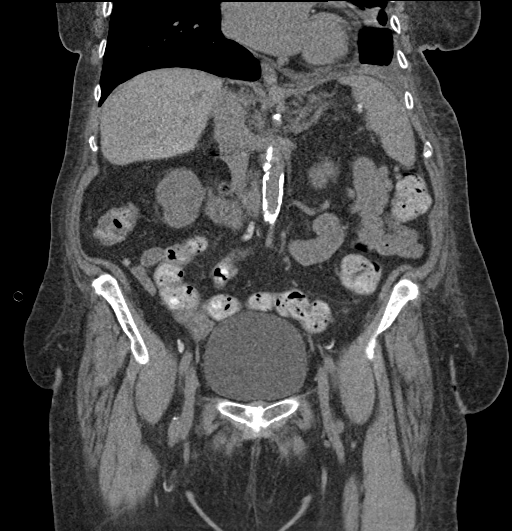
[im 82/147  soft-tissue]
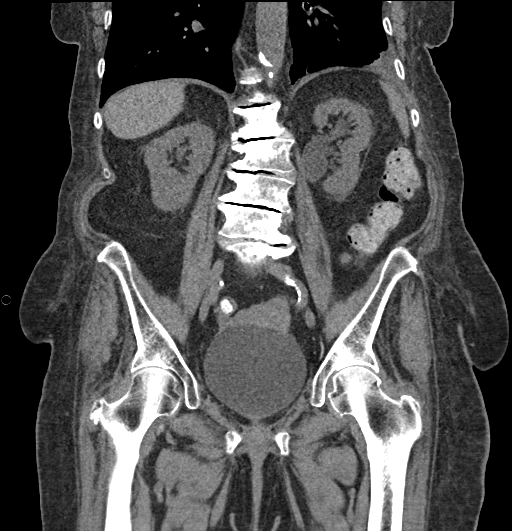

[15 of 46 positions shown; findings below may reference images not displayed]

FINDINGS: Lower chest: Right lower lobe calcified granuloma.

4 mm ground-glass nodule in the right lower lobe on image [DATE] does
not warrant imaging follow-up per consensus guidelines. Lingular and
left lower lobe compressive atelectasis. Mild cardiomegaly with
right coronary artery calcification. Small left pleural effusion.

Hepatobiliary: Normal liver. Cholecystectomy, without biliary ductal
dilatation.

Pancreas: Normal, without mass or ductal dilatation.

Spleen: Normal in size, without focal abnormality.

Adrenals/Urinary Tract: Normal adrenal glands. Probable punctate
upper pole right renal collecting system calculus, only apparent on
coronal image 75. Left-sided extrarenal pelvis. No hydronephrosis.
No hydroureter or ureteric calculi. No bladder calculi.

Stomach/Bowel: Normal stomach, without wall thickening. Colonic
stool burden suggests constipation. Normal terminal ileum and
appendix. Periampullary duodenal diverticulum is diminutive. Normal
small bowel.

Vascular/Lymphatic: Advanced aortic and branch vessel
atherosclerosis. No abdominopelvic adenopathy.

Reproductive: Small uterine calcifications may represent underlying
dystrophic fibroids. No adnexal mass.

Other: No significant free fluid. Mild to moderate pelvic floor
laxity. Fat containing ventral pelvic wall hernia is small. Small
bilateral fat containing inguinal hernias.

Musculoskeletal: Lumbosacral spondylosis. Mild convex left lumbar
spine curvature.
IMPRESSION: 1. Possible punctate upper pole right renal collecting system
calculus. No obstructive uropathy.
2. Possible constipation.
3. Small left pleural effusion.
4. Coronary artery atherosclerosis. Aortic Atherosclerosis
([K4]-[K4]).

## 2021-12-04 MED ORDER — LIDOCAINE 5 % EX PTCH: 1.0000 | MEDICATED_PATCH | CUTANEOUS | 0 refills | Status: DC

## 2021-12-04 MED ORDER — LIDOCAINE 5 % EX PTCH
1.0000 | MEDICATED_PATCH | Freq: Once | CUTANEOUS | Status: DC
  Administered 2021-12-04: 1 via TRANSDERMAL
  Filled 2021-12-04: qty 1

## 2021-12-04 MED ORDER — METOPROLOL TARTRATE 5 MG/5ML IV SOLN
2.5000 mg | Freq: Once | INTRAVENOUS | Status: AC
Start: 2021-12-04 — End: 2021-12-04
  Administered 2021-12-04: 2.5 mg via INTRAVENOUS
  Filled 2021-12-04: qty 5

## 2021-12-04 MED ORDER — SODIUM CHLORIDE 0.9 % IV BOLUS
500.0000 mL | Freq: Once | INTRAVENOUS | Status: AC
  Administered 2021-12-04: 500 mL via INTRAVENOUS

## 2021-12-04 MED ORDER — HYDROCODONE-ACETAMINOPHEN 5-325 MG PO TABS
1.0000 | ORAL_TABLET | Freq: Once | ORAL | Status: AC
  Administered 2021-12-04: 1 via ORAL
  Filled 2021-12-04: qty 1

## 2021-12-04 MED ORDER — HYDROCODONE-ACETAMINOPHEN 5-325 MG PO TABS: 1.0000 | ORAL_TABLET | Freq: Four times a day (QID) | ORAL | 0 refills | Status: DC | PRN

## 2021-12-04 NOTE — ED Provider Triage Note (Signed)
Emergency Medicine Provider Triage Evaluation Note  Kayla Zuniga , a 88 y.o. female  was evaluated in triage.  Pt complains of left lower back pain that started this morning.  Patient was recently treated for UTI.  She does not have any urinary symptoms right now.  No weakness or numbness to her lower extremities.  No fever or chills.  No injury or trauma  Review of Systems  Positive:  Negative: See above  Physical Exam  BP (!) 144/106 (BP Location: Left Arm)   Pulse 91   Temp 97.6 F (36.4 C) (Oral)   Resp 18   Ht 5' (1.524 m)   Wt 72.1 kg   SpO2 99%   BMI 31.05 kg/m  Gen:   Awake, no distress   Resp:  Normal effort  MSK:   No tenderness over the thoracic or lumbar spine.  There is point tenderness over the left paralumbar musculature.  Questionable CVA tenderness Other:    Medical Decision Making  Medically screening exam initiated at 11:26 AM.  Appropriate orders placed.  LINDAY RHODES was informed that the remainder of the evaluation will be completed by another provider, this initial triage assessment does not replace that evaluation, and the importance of remaining in the ED until their evaluation is complete.     Honor Loh [place], [place] 12/04/21 1127

## 2021-12-04 NOTE — ED Provider Notes (Signed)
[facility]-EMERGENCY DEPT Provider Note   CSN: [id number] Arrival date & time: 12/04/21  1106     History  Chief Complaint  Patient presents with   Back Pain    Kayla Zuniga is a 88 y.o. female.  HPI 88 year old female with a history of A-fib on Eliquis, diabetes, hypertension, hyperlipidemia presents with left low back pain that started this morning.  No trauma recently.  Patient has developed pain in her left low back that does not radiate.  There is no abdominal pain or pain down her legs.  When she walks it feels like her legs are weak but also painful.  She took 1 extra strength Tylenol.  At rest she has no pain.  She denies any fevers.  She denies any dysuria but states she has not urinated much.  She does not really feel like she needs to urinate and has not drink much water this morning.  No numbness in the legs.  Home Medications Prior to Admission medications   Medication Sig Start Date End Date Taking? Authorizing Provider  apixaban (ELIQUIS) 5 MG TABS tablet TAKE 1 TABLET BY MOUTH  TWICE DAILY Patient taking differently: Take 5 mg by mouth 2 (two) times daily. 09/13/21  Yes Chilton Si, MD  atorvastatin (LIPITOR) 10 MG tablet Take 10 mg by mouth daily. 02/20/17  Yes [provider]  Cholecalciferol (VITAMIN D3) 50 MCG (2000 UT) TABS Take 2,000 Units by mouth daily.   Yes [provider]  escitalopram (LEXAPRO) 10 MG tablet Take 10 mg by mouth daily.   Yes [provider]  furosemide (LASIX) 20 MG tablet Take 1 tablet (20 mg total) by mouth daily. 04/24/17  Yes Newman Nip, NP  HYDROcodone-acetaminophen (NORCO) 5-325 MG tablet Take 1 tablet by mouth every 6 (six) hours as needed. 12/04/21  Yes Pricilla Loveless, MD  levothyroxine (SYNTHROID) 25 MCG tablet Take 25-50 mcg by mouth See admin instructions. Take 25 mcg by mouth in the morning before breakfast on Sunday and 50 mcg on Mon/Tues/Wed/Thurs/Fri/Sat   Yes [provider]  lidocaine (LIDODERM) 5 % Place 1 patch onto the skin daily. Remove & Discard patch within 12 hours or as directed by MD 12/04/21  Yes Pricilla Loveless, MD  Multiple Vitamins-Minerals [facility] EYE HEALTH FORMULA) CAPS Take 1 capsule by mouth daily with breakfast.   Yes [provider]  nebivolol (BYSTOLIC) 10 MG tablet Take 10 mg by mouth at bedtime.   Yes [provider]  Omega-3 Fatty Acids (FISH OIL) 1000 MG CAPS Take 1,000 mg by mouth daily.   Yes [provider]  potassium chloride (KLOR-CON 10) 10 MEQ tablet Take 10 mEq by mouth at bedtime.   Yes [provider]  valsartan (DIOVAN) 160 MG tablet Take 160 mg by mouth at bedtime.   Yes [provider]  diltiazem (CARDIZEM CD) 240 MG 24 hr capsule Take 1 capsule (240 mg total) by mouth daily. Patient not taking: Reported on 12/04/2021 02/24/21   Chilton Si, MD  valsartan (DIOVAN) 320 MG tablet Take 1 tablet (320 mg total) by mouth daily. Patient not taking: Reported on 12/04/2021 04/03/17   Newman Nip, NP      Allergies    Penicillins, Sulfa antibiotics, Atenolol, Ibandronic acid, Alendronate, Amlodipine besylate, and Prolia [denosumab]    Review of Systems   Review of Systems  Constitutional:  Negative for fever.  Gastrointestinal:  Negative for abdominal pain.  Genitourinary:  Positive for decreased  urine volume. Negative for dysuria and hematuria.  Musculoskeletal:  Positive for back pain.  Neurological:  Positive for weakness. Negative for numbness.   Physical Exam Updated Vital Signs BP 134/83   Pulse 98   Temp 97.6 F (36.4 C) (Oral)   Resp (!) 22   Ht 5' (1.524 m)   Wt 72.1 kg   SpO2 98%   BMI 31.05 kg/m  Physical Exam Vitals and nursing note reviewed.  Constitutional:      General: She is not in acute distress.    Appearance: She is well-developed. She is not ill-appearing or diaphoretic.  HENT:     Head: Normocephalic and atraumatic.   Cardiovascular:     Rate and Rhythm: Normal rate and regular rhythm.     Heart sounds: Normal heart sounds.  Pulmonary:     Effort: Pulmonary effort is normal.     Breath sounds: Normal breath sounds.  Abdominal:     Palpations: Abdomen is soft.     Tenderness: There is no abdominal tenderness. There is left CVA tenderness. There is no right CVA tenderness.  Musculoskeletal:     Thoracic back: No bony tenderness.     Lumbar back: Tenderness (left lower lumbar back. no rash) present. No bony tenderness.     Right hip: No tenderness. Normal range of motion.     Left hip: No tenderness. Normal range of motion.  Skin:    General: Skin is warm and dry.  Neurological:     Mental Status: She is alert.     Comments: 5/5 strength in BLE. Grossly normal sensation.  Normal plantar and dorsiflexion of both feet    ED Results / Procedures / Treatments   Labs (all labs ordered are listed, but only abnormal results are displayed) Labs Reviewed  BASIC METABOLIC PANEL - Abnormal; Notable for the following components:      Result Value   Glucose, Bld 174 (*)    BUN 24 (*)    All other components within normal limits  CBC WITH DIFFERENTIAL/PLATELET - Abnormal; Notable for the following components:   WBC 11.6 (*)    RBC 3.63 (*)    MCV 103.9 (*)    MCH 35.5 (*)    Neutro Abs 9.9 (*)    All other components within normal limits  URINALYSIS, ROUTINE W REFLEX MICROSCOPIC - Abnormal; Notable for the following components:   APPearance HAZY (*)    Leukocytes,Ua MODERATE (*)    All other components within normal limits  CBG MONITORING, ED - Abnormal; Notable for the following components:   Glucose-Capillary 208 (*)    All other components within normal limits  URINE CULTURE    EKG EKG Interpretation  Date/Time:  Sunday December 04 2021 15:15:46 EDT Ventricular Rate:  126 PR Interval:    QRS Duration: 77 QT Interval:  317 QTC Calculation: 459 R Axis:   49 Text Interpretation: Atrial  fibrillation Low voltage, extremity leads Confirmed by Pricilla Loveless [phone]) on 12/04/2021 3:25:18 PM  Radiology MR THORACIC SPINE WO CONTRAST  Result Date: 12/04/2021 CLINICAL DATA:  Low back pain EXAM: MRI THORACIC SPINE WITHOUT CONTRAST TECHNIQUE: Multiplanar, multisequence MR imaging of the thoracic spine was performed. No intravenous contrast was administered. COMPARISON:  None Available. FINDINGS: Alignment: Mildly exaggerated thoracic kyphosis. No static listhesis. Vertebrae: No fracture, evidence of discitis, or bone lesion. Multilevel discogenic endplate marrow changes. Scattered small endplate Schmorl's nodes. Cord: Cord is focally diminutive with subtly increased T2/STIR signal at the T11-12 level (  series 17, image 12). Thoracic cord appears otherwise within normal limits. There is canal stenosis with impress upon the cervical cord at the C4-5, C5-6, and C6-7 levels seen at the edge of the field of view (series 17, image 10). Paraspinal and other soft tissues: Small layering left pleural effusion. Degenerative arthropathy of the bilateral sternoclavicular joints. Disc levels: Multilevel intervertebral disc height loss with multiple small disc bulges and endplate spurring throughout the thoracic spine. Findings most notably involve the T11-12 level where disc bulge and posterior element hypertrophy contribute to at least moderate canal stenosis. Mild canal stenosis at T10-11. No additional sites of canal stenosis within the thoracic spine. Left-sided foraminal stenosis at T9-10 through T11-T12. IMPRESSION: 1. Multilevel degenerative changes of the thoracic spine, most pronounced at the T11-12 level where there is at least moderate canal stenosis and left-sided foraminal stenosis. 2. Focally diminutive appearance of the thoracic spinal cord at the T11-12 level with subtly increased cord signal suggesting chronic compressive myelomalacia. 3. Partially visualized cervical spondylosis with canal stenosis  at the C4-5, C5-6, and C6-7 levels seen at the edge of the field of view. This could be further characterized with dedicated cervical spine MRI, as indicated by patient's symptoms. 4. Small layering left pleural effusion. Electronically Signed   By: Duanne Guess D.O.   On: 12/04/2021 16:39   MR LUMBAR SPINE WO CONTRAST  Result Date: 12/04/2021 CLINICAL DATA:  Low back pain, cauda equina syndrome suspected EXAM: MRI LUMBAR SPINE WITHOUT CONTRAST TECHNIQUE: Multiplanar, multisequence MR imaging of the lumbar spine was performed. No intravenous contrast was administered. COMPARISON:  CT 12/04/2021 FINDINGS: Segmentation:  Standard. Alignment: Mild lumbar levocurvature. Grade 1 anterolisthesis of L4 on L5 and L5 on S1. Vertebrae: No fracture, evidence of discitis, or bone lesion. Multilevel discogenic endplate marrow changes. Conus medullaris and cauda equina: Conus extends to the L1-2 level. Slight bunching of the cauda equina nerve roots above the levels of maximal stenosis. Subtly increased T2/STIR signal within the lower thoracic cord at T11-T12. Cord is minimally diminutive in caliber at this location. Paraspinal and other soft tissues: Negative. Disc levels: T12-L1: Disc height loss with mild disc bulge, slightly eccentric to the right. Mild bilateral facet arthropathy. Mild canal stenosis with mild right foraminal stenosis. L1-L2: Disc height loss with endplate ridging and mild disc bulge. Moderate right and mild left facet arthropathy with ligamentum flavum buckling. Moderate canal stenosis with severe right and mild left foraminal stenosis. L2-L3: Disc height loss with mild shallow central disc protrusion and endplate ridging. Moderate bilateral facet arthropathy with ligamentum flavum buckling. Moderate canal stenosis with moderate to severe right and mild left foraminal stenosis. L3-L4: Disc height loss with endplate ridging and mild disc bulge. Moderate-severe bilateral facet arthropathy with  ligamentum flavum buckling. Moderate canal stenosis with severe right and moderate-severe left foraminal stenosis. L4-L5: Mild disc uncovering with diffuse disc bulge and endplate spurring. Moderate-severe bilateral facet arthropathy with ligamentum flavum buckling. Findings result in severe canal stenosis with moderate-severe left and mild right foraminal stenosis. L5-S1: Anterolisthesis with disc uncovering and mild diffuse disc bulge. Moderate-severe bilateral facet arthropathy. Moderate bilateral foraminal stenosis. No significant canal stenosis. IMPRESSION: 1. Advanced multilevel degenerative changes of the lumbar spine. Findings are most pronounced at the L4-5 level where there is severe canal stenosis and moderate-severe left foraminal stenosis. 2. Moderate canal stenosis at the L1-2, L2-3, and L3-4 levels. 3. Severe right foraminal stenosis at L1-2 and L2-3. 4. Stenosis within the lower thoracic spine, as described on dedicated  thoracic spine MR. Electronically Signed   By: Duanne Guess D.O.   On: 12/04/2021 16:58   CT Renal Stone Study  Result Date: 12/04/2021 CLINICAL DATA:  Back pain.  Kidney stone suspected. EXAM: CT ABDOMEN AND PELVIS WITHOUT CONTRAST TECHNIQUE: Multidetector CT imaging of the abdomen and pelvis was performed following the standard protocol without IV contrast. RADIATION DOSE REDUCTION: This exam was performed according to the departmental dose-optimization program which includes automated exposure control, adjustment of the mA and/or kV according to patient size and/or use of iterative reconstruction technique. COMPARISON:  None Available. FINDINGS: Lower chest: Right lower lobe calcified granuloma. 4 mm ground-glass nodule in the right lower lobe on image 26/6 does not warrant imaging follow-up per consensus guidelines. Lingular and left lower lobe compressive atelectasis. Mild cardiomegaly with right coronary artery calcification. Small left pleural effusion. Hepatobiliary:  Normal liver. Cholecystectomy, without biliary ductal dilatation. Pancreas: Normal, without mass or ductal dilatation. Spleen: Normal in size, without focal abnormality. Adrenals/Urinary Tract: Normal adrenal glands. Probable punctate upper pole right renal collecting system calculus, only apparent on coronal image 75. Left-sided extrarenal pelvis. No hydronephrosis. No hydroureter or ureteric calculi. No bladder calculi. Stomach/Bowel: Normal stomach, without wall thickening. Colonic stool burden suggests constipation. Normal terminal ileum and appendix. Periampullary duodenal diverticulum is diminutive. Normal small bowel. Vascular/Lymphatic: Advanced aortic and branch vessel atherosclerosis. No abdominopelvic adenopathy. Reproductive: Small uterine calcifications may represent underlying dystrophic fibroids. No adnexal mass. Other: No significant free fluid. Mild to moderate pelvic floor laxity. Fat containing ventral pelvic wall hernia is small. Small bilateral fat containing inguinal hernias. Musculoskeletal: Lumbosacral spondylosis. Mild convex left lumbar spine curvature. IMPRESSION: 1. Possible punctate upper pole right renal collecting system calculus. No obstructive uropathy. 2. Possible constipation. 3. Small left pleural effusion. 4. Coronary artery atherosclerosis. Aortic Atherosclerosis (ICD10-I70.0). Electronically Signed   By: Jeronimo Greaves M.D.   On: 12/04/2021 13:47    Procedures Procedures    Medications Ordered in ED Medications  lidocaine (LIDODERM) 5 % 1 patch (1 patch Transdermal Patch Applied 12/04/21 1945)  sodium chloride 0.9 % bolus 500 mL (0 mLs Intravenous Stopped 12/04/21 1511)  metoprolol tartrate (LOPRESSOR) injection 2.5 mg (2.5 mg Intravenous Given 12/04/21 1945)  HYDROcodone-acetaminophen (NORCO/VICODIN) 5-325 MG per tablet 1 tablet (1 tablet Oral Given 12/04/21 1945)    ED Course/ Medical Decision Making/ A&P Clinical Course as of 12/04/21 2237  Sun Dec 04, 2021  1506  Patient was able to ambulate with a walker she felt shaky in her legs.  Unfortunately she has had continued urinary retention it seems like.  She is able to urinate but that every time I take a bladder scanner or bedside ultrasound over her bladder still looks full.  Given this with acute back pain and no obvious UTI I think it would be reasonable to get MRI T/L-spine.  She declines pain currently. [SG]    Clinical Course User Index [SG] Pricilla Loveless, MD                           Medical Decision Making Amount and/or Complexity of Data Reviewed Radiology: ordered.  Risk Prescription drug management.   Patient presents with acute back pain.  Initially it seemed like this was acute today though further talking with family after initial work-up it seems like this has been going on for at least a week but was worse today.  CT stone study was obtained and I do not see any obvious  ureteral stone or abdominal/retroperitoneal emergency.  I personally viewed/interpreted these images.  No radicular symptoms with there is questionable urinary retention which I think essentially has been ruled out as she was essentially continuously peeing after being given IV fluids but most recently after getting up to walk her postvoid residual was about 130 cc.  However given this an MRI had been obtained of her T and L-spine.  There is significant abnormalities but no evidence of cauda equina.  She has no focal deficits.  She is feeling better after some hydrocodone.  She was also noted to have some mild to moderate tachycardia with a heart rate ranging around 120.  IV metoprolol helped with this and she will then take her home meds.  Otherwise a Lidoderm patch was given.  At this point, she is feeling better and feels well enough to go home and her daughter will stay with her as she often does.  PCP has been arranging home health.  Will discharge home with return precautions.        Final Clinical Impression(s) /  ED Diagnoses Final diagnoses:  Acute left-sided low back pain without sciatica  Generalized weakness    Rx / DC Orders ED Discharge Orders          Ordered    HYDROcodone-acetaminophen (NORCO) 5-325 MG tablet  Every 6 hours PRN        12/04/21 2045    lidocaine (LIDODERM) 5 %  Every 24 hours        12/04/21 2045              Pricilla LovelessGoldston, Season Astacio, MD 12/04/21 2238

## 2021-12-04 NOTE — ED Triage Notes (Signed)
Patient c/o left lower back pain since this AM. Patient is having issues getting out of a chair. Patient denies any injury.

## 2021-12-04 NOTE — ED Notes (Signed)
Pt notified of the need for a urine sample. Pt attempting to urinate at this time.

## 2021-12-04 NOTE — Discharge Instructions (Addendum)
If you develop worsening, recurrent, or continued back pain, numbness or weakness in the legs, incontinence of your bowels or bladders, numbness of your buttocks, fever, abdominal pain, or any other new/concerning symptoms then return to the ER for evaluation.    Your MRI does show multiple areas of stenosis and degenerative change.  You should follow-up with the neurosurgeon listed for further outpatient treatment.  You are being prescribed hydrocodone.  Use this for breakthrough pain if Tylenol is not enough.  You are also being prescribed a patch called lighted Derm which can help numb the area and improve pain.  Do not combine these medicines with other medicines or alcohol.

## 2021-12-05 LAB — URINE CULTURE: Culture: 20000 — AB

## 2021-12-06 ENCOUNTER — Telehealth: Payer: Self-pay | Admitting: Emergency Medicine

## 2021-12-06 NOTE — Progress Notes (Signed)
ED Antimicrobial Stewardship Positive Culture Follow Up   Kayla Zuniga is an 88 y.o. female who presented to [facility] on 12/04/2021 with a chief complaint of  Chief Complaint  Patient presents with   Back Pain    Recent Results (from the past 720 hour(s))  Urine Culture     Status: Abnormal   Collection Time: 12/04/21  1:53 PM   Specimen: Urine, Clean Catch  Result Value Ref Range Status   Specimen Description   Final    URINE, CLEAN CATCH Performed at [facility], [address], [place], [place] [address]    Special Requests   Final    NONE Performed at [facility], [address] [place], [place] [address]    Culture (A)  Final    20,000 COLONIES/mL GROUP B STREP(S.AGALACTIAE)ISOLATED TESTING AGAINST S. AGALACTIAE NOT ROUTINELY PERFORMED DUE TO PREDICTABILITY OF AMP/PEN/VAN SUSCEPTIBILITY. 40,000 COLONIES/mL AEROCOCCUS SPECIES Standardized susceptibility testing for this organism is not available. Performed at [facility], [address], [place], [place] [address]    Report Status 12/05/2021 FINAL  Final    []  Patient discharged originally without antimicrobial agent. Call patient to check on back pain symptoms. If patient feels they have gotten better, no treatment needed. If back pain symptoms have stayed the same or gotten worse, call in a prescription for Keflex 500 mg BID x 5 days.   ED Provider: , PA-C   Fayrene Helper 12/06/2021, 8:49 AM PharmD Candidate

## 2021-12-06 NOTE — Telephone Encounter (Signed)
Post ED Visit - Positive Culture Follow-up: Successful Patient Follow-Up  Culture assessed and recommendations reviewed by:  []  , Pharm.D. []  Enzo Bi, Pharm.D., BCPS AQ-ID []  , Pharm.D., BCPS []  Celedonio Miyamoto, Pharm.D., BCPS []  [place], Garvin Fila.D., BCPS, AAHIVP []  , Pharm.D., BCPS, AAHIVP []  Georgina Pillion, PharmD, BCPS []  , PharmD, BCPS []  [place], PharmD, BCPS []  [address], PharmD  Positive urine culture  []  Patient discharged without antimicrobial prescription and treatment is now indicated []  Organism is resistant to prescribed ED discharge antimicrobial []  Patient with positive blood cultures  Changes discussed with ED provider: [facility] New antibiotic prescription symptom check, if worse or continued pain start keflex 500mg  po bid x 5 days Patient states feel better and decided she does not need treatment at this time     12/06/2021, 11:51 AM

## 2021-12-16 ENCOUNTER — Emergency Department (HOSPITAL_COMMUNITY): Payer: Medicare Other

## 2021-12-16 ENCOUNTER — Emergency Department (HOSPITAL_COMMUNITY)
Admission: EM | Admit: 2021-12-16 | Discharge: 2021-12-16 | Disposition: A | Discharge status: Home / Self Care | Payer: Medicare Other | Attending: Emergency Medicine | Admitting: Emergency Medicine

## 2021-12-16 ENCOUNTER — Encounter (HOSPITAL_COMMUNITY): Payer: Self-pay | Admitting: Emergency Medicine

## 2021-12-16 DIAGNOSIS — W19XXXA Unspecified fall, initial encounter: Secondary | ICD-10-CM

## 2021-12-16 DIAGNOSIS — I129 Hypertensive chronic kidney disease with stage 1 through stage 4 chronic kidney disease, or unspecified chronic kidney disease: Secondary | ICD-10-CM | POA: Diagnosis not present

## 2021-12-16 DIAGNOSIS — E860 Dehydration: Secondary | ICD-10-CM | POA: Diagnosis not present

## 2021-12-16 DIAGNOSIS — W010XXA Fall on same level from slipping, tripping and stumbling without subsequent striking against object, initial encounter: Secondary | ICD-10-CM | POA: Diagnosis not present

## 2021-12-16 DIAGNOSIS — E039 Hypothyroidism, unspecified: Secondary | ICD-10-CM | POA: Insufficient documentation

## 2021-12-16 DIAGNOSIS — E1122 Type 2 diabetes mellitus with diabetic chronic kidney disease: Secondary | ICD-10-CM | POA: Diagnosis not present

## 2021-12-16 DIAGNOSIS — N189 Chronic kidney disease, unspecified: Secondary | ICD-10-CM | POA: Diagnosis not present

## 2021-12-16 DIAGNOSIS — Z79899 Other long term (current) drug therapy: Secondary | ICD-10-CM | POA: Insufficient documentation

## 2021-12-16 DIAGNOSIS — Z7901 Long term (current) use of anticoagulants: Secondary | ICD-10-CM | POA: Diagnosis not present

## 2021-12-16 DIAGNOSIS — S060X0A Concussion without loss of consciousness, initial encounter: Secondary | ICD-10-CM | POA: Diagnosis not present

## 2021-12-16 DIAGNOSIS — S0990XA Unspecified injury of head, initial encounter: Secondary | ICD-10-CM | POA: Diagnosis present

## 2021-12-16 DIAGNOSIS — S060XAA Concussion with loss of consciousness status unknown, initial encounter: Secondary | ICD-10-CM

## 2021-12-16 LAB — I-STAT CHEM 8, ED
BUN: 20 mg/dL (ref 8–23)
Calcium, Ion: 1 mmol/L — ABNORMAL LOW (ref 1.15–1.40)
Chloride: 104 mmol/L (ref 98–111)
Creatinine, Ser: 1 mg/dL (ref 0.44–1.00)
Glucose, Bld: 139 mg/dL — ABNORMAL HIGH (ref 70–99)
HCT: 36 % (ref 36.0–46.0)
Hemoglobin: 12.2 g/dL (ref 12.0–15.0)
Potassium: 4.4 mmol/L (ref 3.5–5.1)
Sodium: 137 mmol/L (ref 135–145)
TCO2: 22 mmol/L (ref 22–32)

## 2021-12-16 LAB — BASIC METABOLIC PANEL
Anion gap: 11 (ref 5–15)
BUN: 18 mg/dL (ref 8–23)
CO2: 24 mmol/L (ref 22–32)
Calcium: 9 mg/dL (ref 8.9–10.3)
Chloride: 104 mmol/L (ref 98–111)
Creatinine, Ser: 1.18 mg/dL — ABNORMAL HIGH (ref 0.44–1.00)
GFR, Estimated: 45 mL/min — ABNORMAL LOW (ref >60)
Glucose, Bld: 139 mg/dL — ABNORMAL HIGH (ref 70–99)
Potassium: 4.5 mmol/L (ref 3.5–5.1)
Sodium: 139 mmol/L (ref 135–145)

## 2021-12-16 LAB — PROTIME-INR
INR: 1.8 — ABNORMAL HIGH (ref 0.8–1.2)
Prothrombin Time: 20.9 seconds — ABNORMAL HIGH (ref 11.4–15.2)

## 2021-12-16 LAB — CBC WITH DIFFERENTIAL/PLATELET
Abs Immature Granulocytes: 0.27 10*3/uL — ABNORMAL HIGH (ref 0.00–0.07)
Basophils Absolute: 0 10*3/uL (ref 0.0–0.1)
Basophils Relative: 0 %
Eosinophils Absolute: 0 10*3/uL (ref 0.0–0.5)
Eosinophils Relative: 0 %
HCT: 35.7 % — ABNORMAL LOW (ref 36.0–46.0)
Hemoglobin: 12.3 g/dL (ref 12.0–15.0)
Immature Granulocytes: 1 %
Lymphocytes Relative: 3 %
Lymphs Abs: 0.8 10*3/uL (ref 0.7–4.0)
MCH: 36.5 pg — ABNORMAL HIGH (ref 26.0–34.0)
MCHC: 34.5 g/dL (ref 30.0–36.0)
MCV: 105.9 fL — ABNORMAL HIGH (ref 80.0–100.0)
Monocytes Absolute: 1.4 10*3/uL — ABNORMAL HIGH (ref 0.1–1.0)
Monocytes Relative: 6 %
Neutro Abs: 20.4 10*3/uL — ABNORMAL HIGH (ref 1.7–7.7)
Neutrophils Relative %: 90 %
Platelets: 187 10*3/uL (ref 150–400)
RBC: 3.37 MIL/uL — ABNORMAL LOW (ref 3.87–5.11)
RDW: 14 % (ref 11.5–15.5)
WBC: 22.9 10*3/uL — ABNORMAL HIGH (ref 4.0–10.5)
nRBC: 0 % (ref 0.0–0.2)

## 2021-12-16 LAB — APTT: aPTT: 33 seconds (ref 24–36)

## 2021-12-16 IMAGING — DX DG CHEST 1V PORT
1 series · 1 of 1 positions shown · non-contrast
Comparison: Prior chest radiographs [DATE].

CLINICAL DATA: Provided history: Fall, head injury, altered mental
status.

EXAM:
PORTABLE CHEST 1 VIEW

[chest ap]
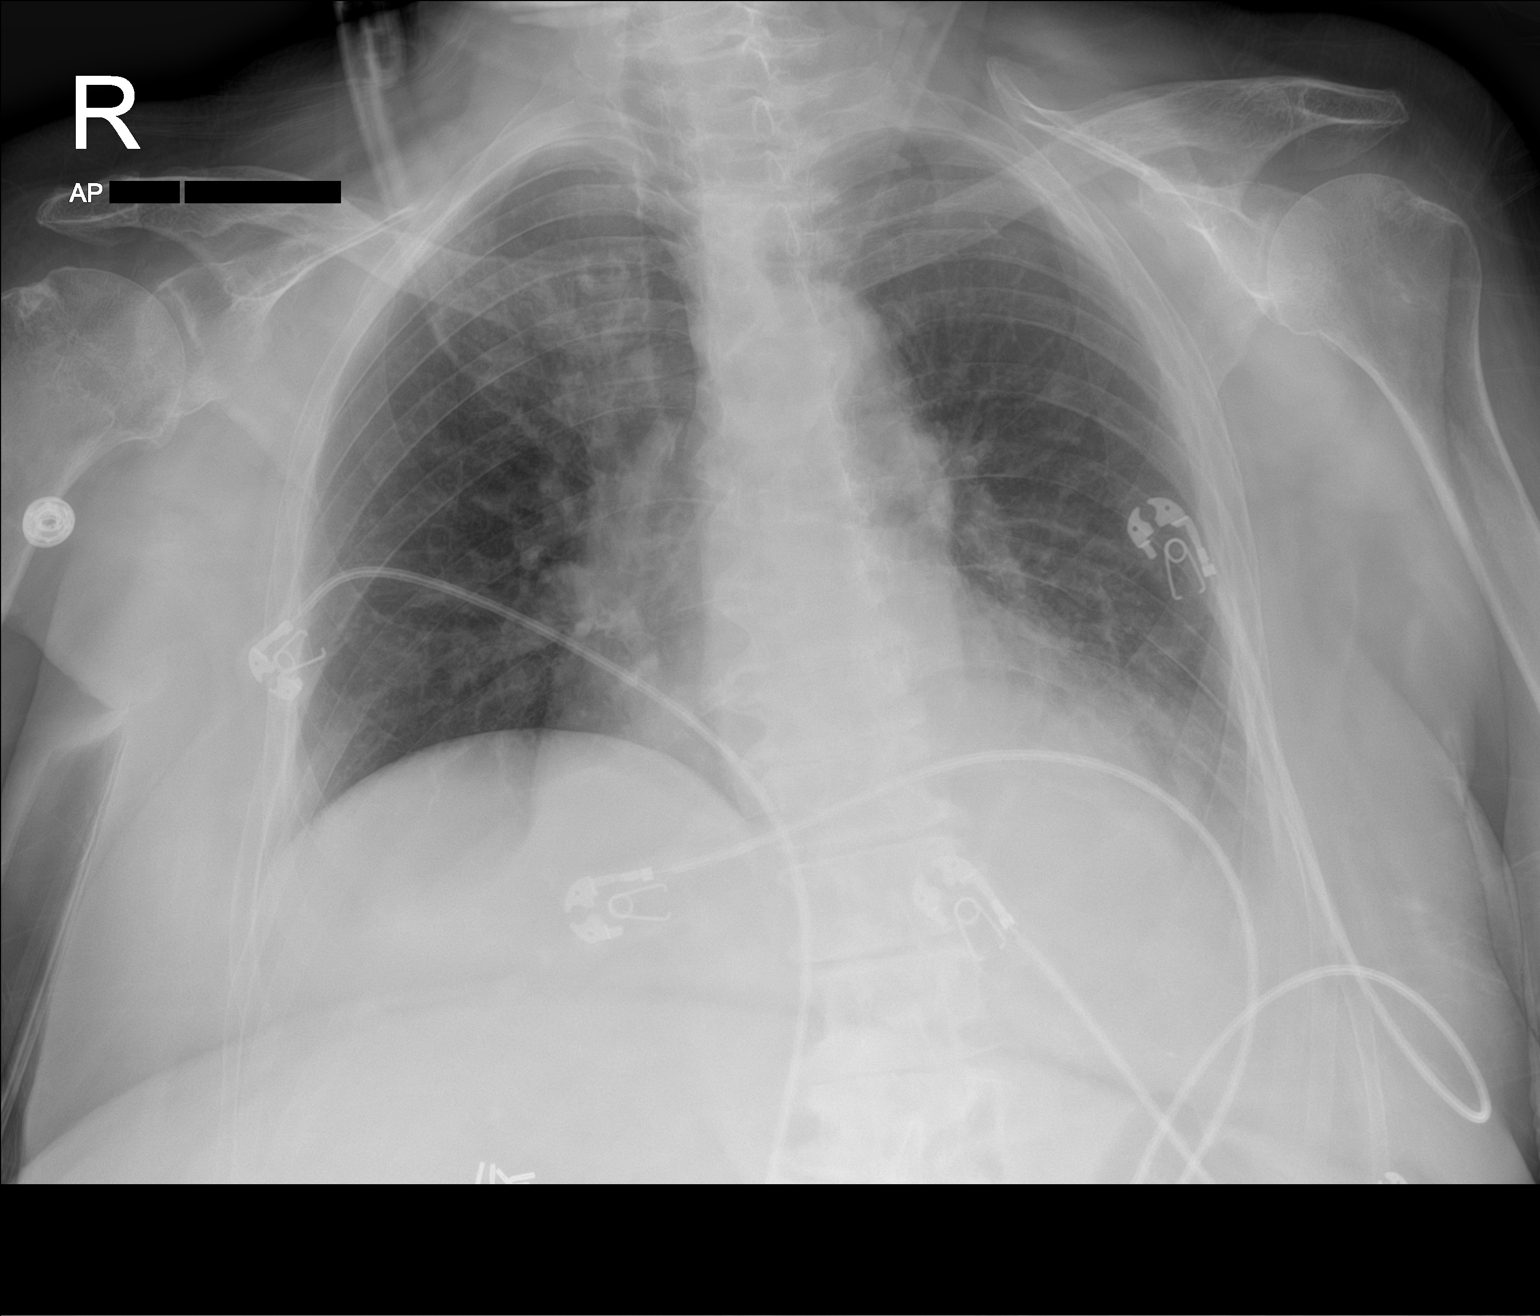

[1 of 1 positions shown; findings below may reference images not displayed]

FINDINGS: Heart size at the upper limits of normal (when accounting for AP
technique). Ill-defined opacity within the medial right upper lobe.
Additional opacity within the left lung base. Small left pleural
effusion. No evidence of pneumothorax. No acute bony abnormality
identified. Degenerative changes of the spine. Levocurvature of the
lower thoracic and upper lumbar spine.
IMPRESSION: Opacities within the medial right upper lobe and left lung base,
which may reflect atelectasis or aspiration/pneumonia.

Small left pleural effusion.

## 2021-12-16 IMAGING — CT CT HEAD W/O CM
4 series · 16 of 47 positions shown, 18 images · non-contrast
Comparison: None Available.

CLINICAL DATA: Head trauma, minor (Age >= 65y)



[Series 3: head wo · axial · 0.48mm/px · z∈[-64,+56]mm · 7 of 32 slices shown, 9 images]
[im 4/32  brain]
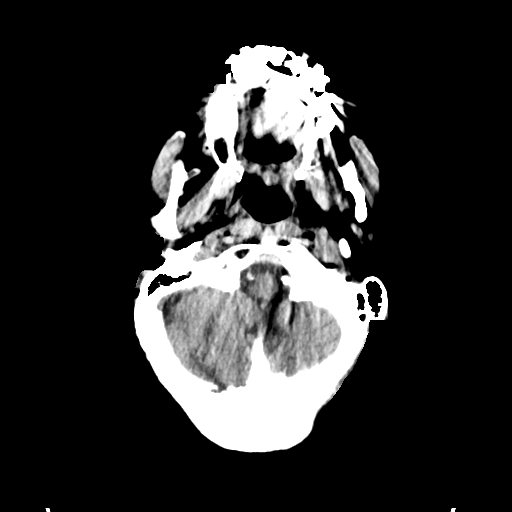
[im 4/32  bone]
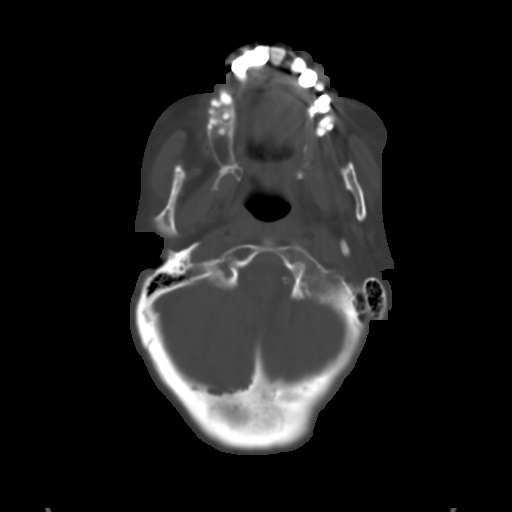
[im 8/32  brain]
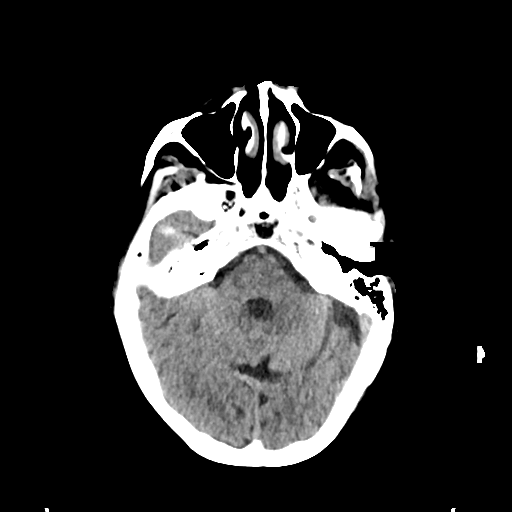
[im 12/32  brain]
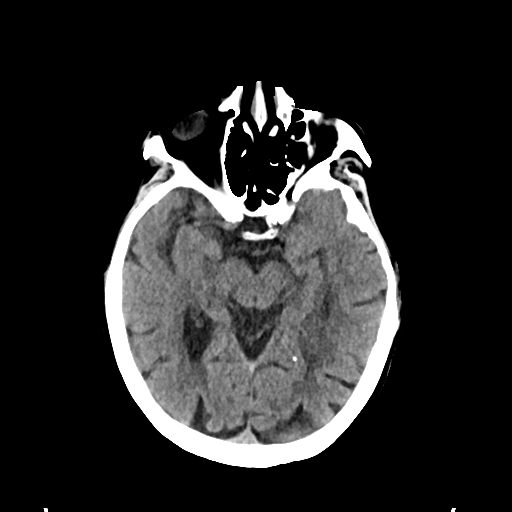
[im 16/32  brain]
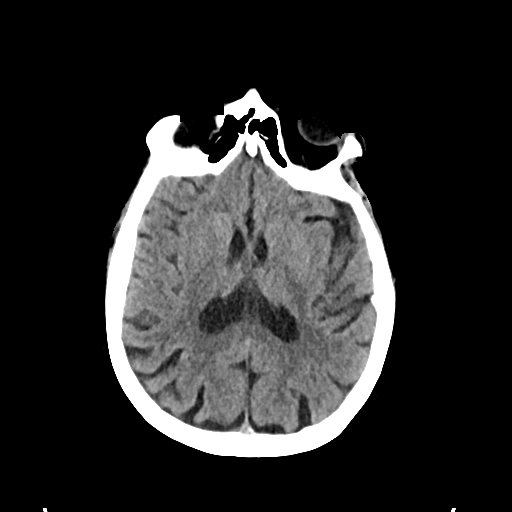
[im 20/32  brain]
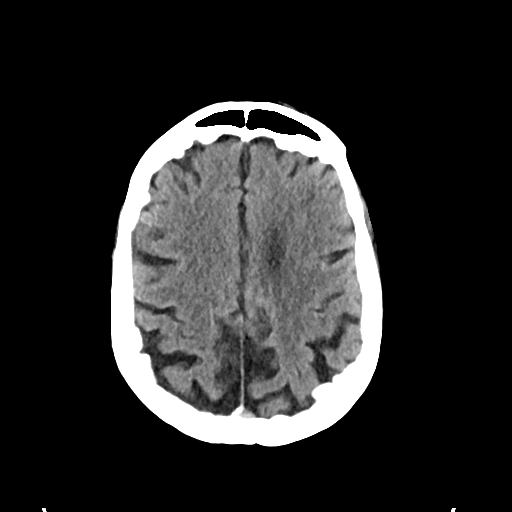
[im 20/32  bone]
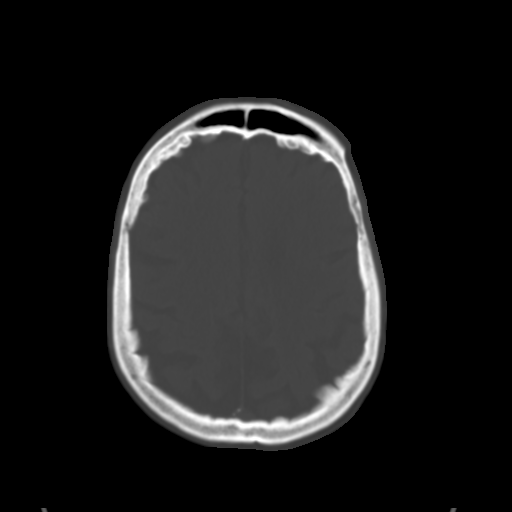
[im 24/32  brain]
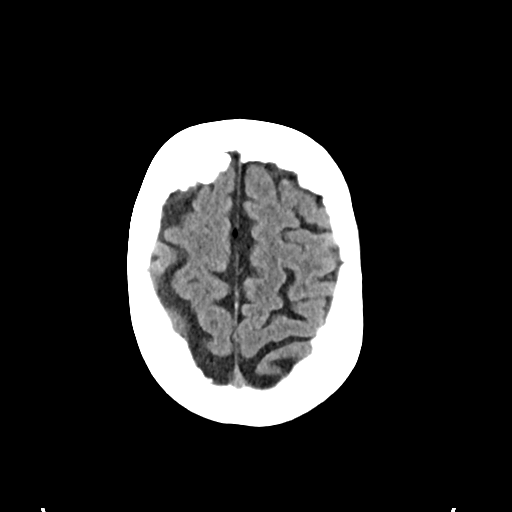
[im 28/32  brain]
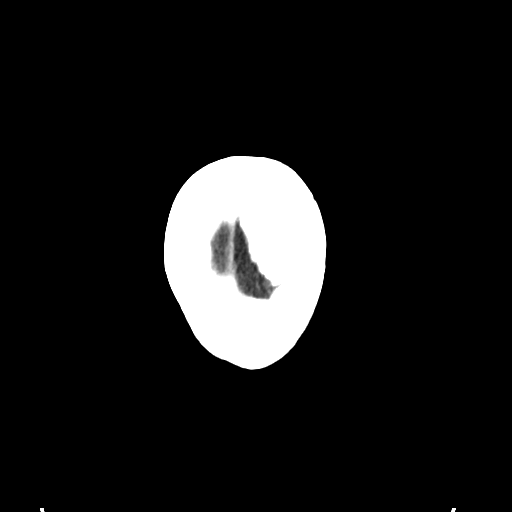

[Series 4: bone · axial · 0.48mm/px · z∈[-65,-33]mm · 3 of 79 slices shown]
[im 8/79  bone]
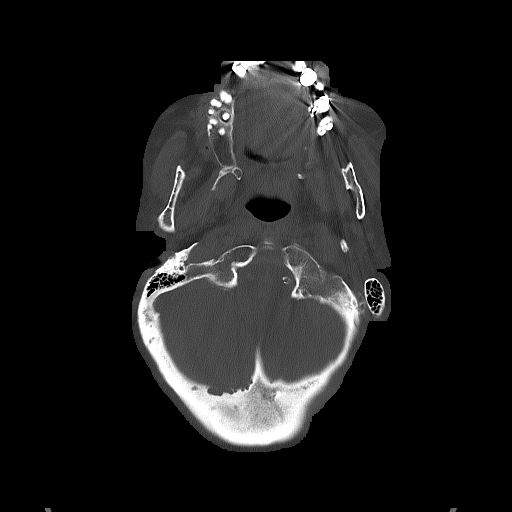
[im 16/79  bone]
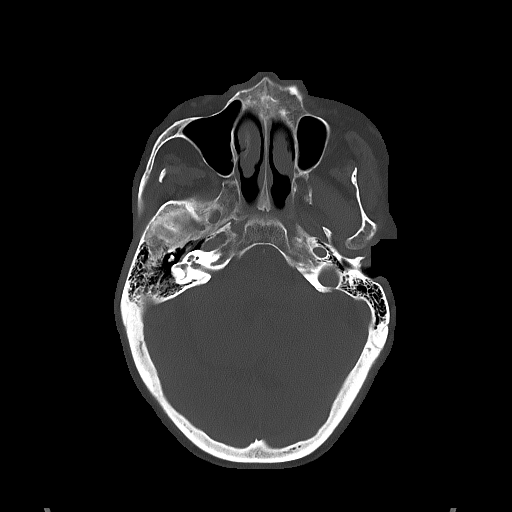
[im 24/79  bone]
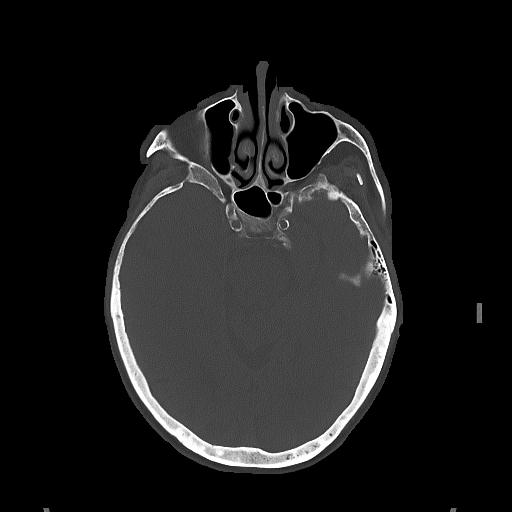

[Series 5: cor st · coronal · 0.35mm/px · 3 of 73 slices shown]
[im 25/73  brain]
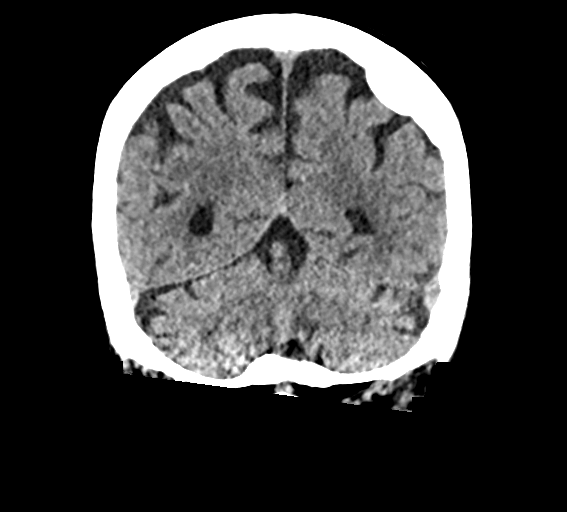
[im 33/73  brain]
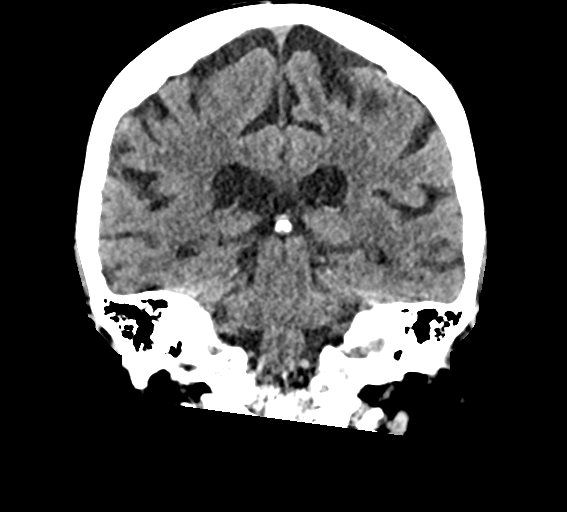
[im 41/73  brain]
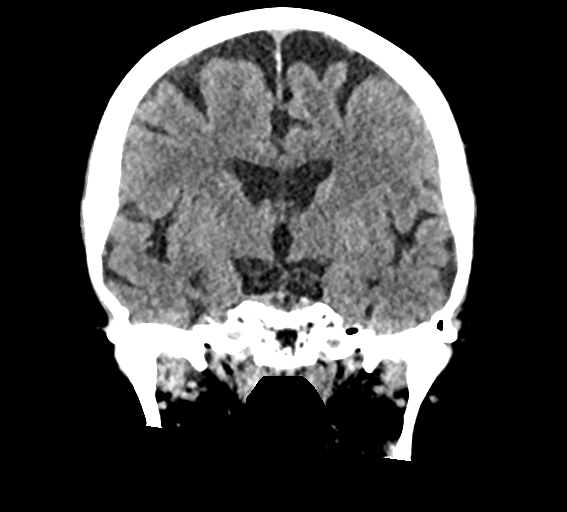

[Series 6: sag st · sagittal · 0.35mm/px · 3 of 52 slices shown]
[im 18/52  brain]
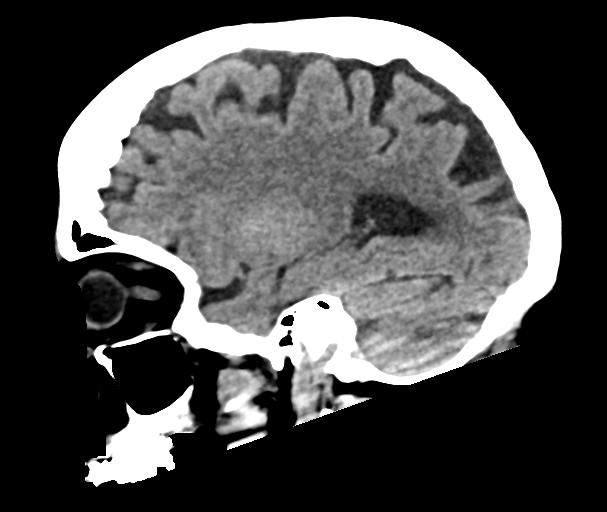
[im 26/52  brain]
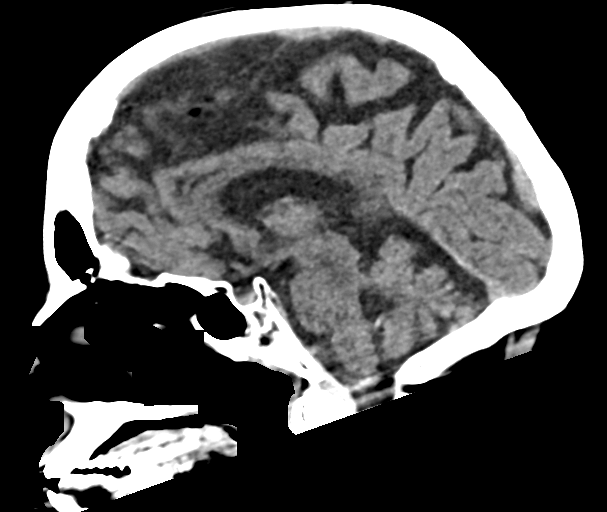
[im 35/52  brain]
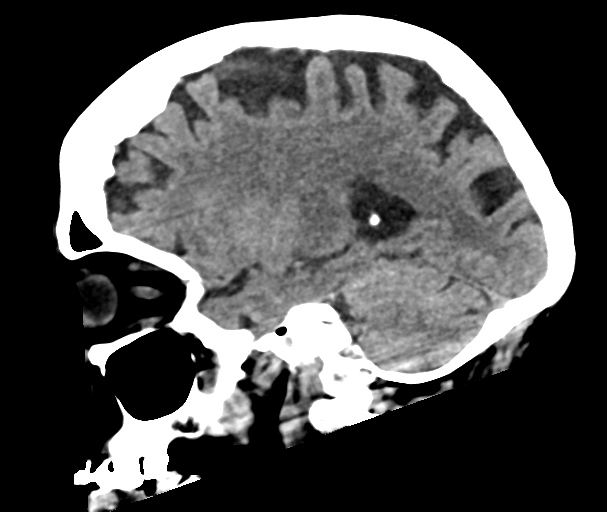

[16 of 47 positions shown; findings below may reference images not displayed]

FINDINGS: Brain: There is no acute intracranial hemorrhage, mass effect, or
edema. Gray-white differentiation is preserved. There is no
extra-axial fluid collection. Prominence of the ventricles and sulci
reflects parenchymal volume loss. Patchy hypoattenuation in the
supratentorial white matter is nonspecific but may reflect mild
chronic microvascular ischemic changes.

Vascular: There is atherosclerotic calcification at the skull base.

Skull: Calvarium is unremarkable.

Sinuses/Orbits: No acute finding.

Other: None.
IMPRESSION: No evidence of acute intracranial injury. Chronic/nonemergent
findings detailed above.

## 2021-12-16 IMAGING — CT CT CERVICAL SPINE W/O CM
3 of 4 series · 12 of 33 positions shown, 14 images · non-contrast
Comparison: None Available.

CLINICAL DATA: Neck trauma (Age >= 65y)



[Series 8: sag bone · sagittal · 0.28mm/px · 5 of 69 slices shown, 6 images]
[im 23/69  bone]
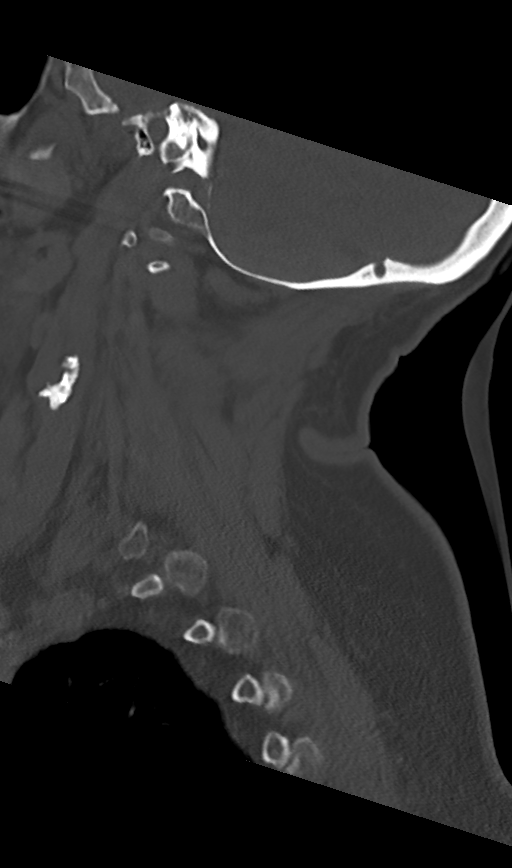
[im 29/69  bone]
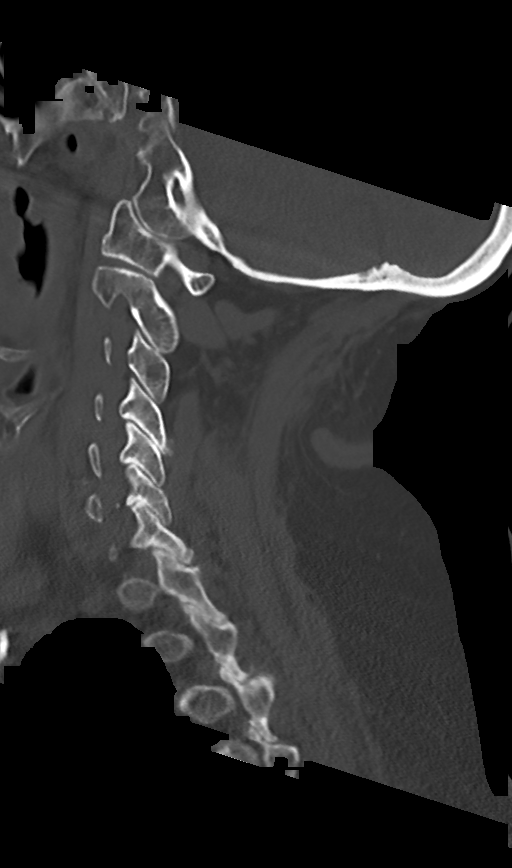
[im 35/69  soft-tissue]
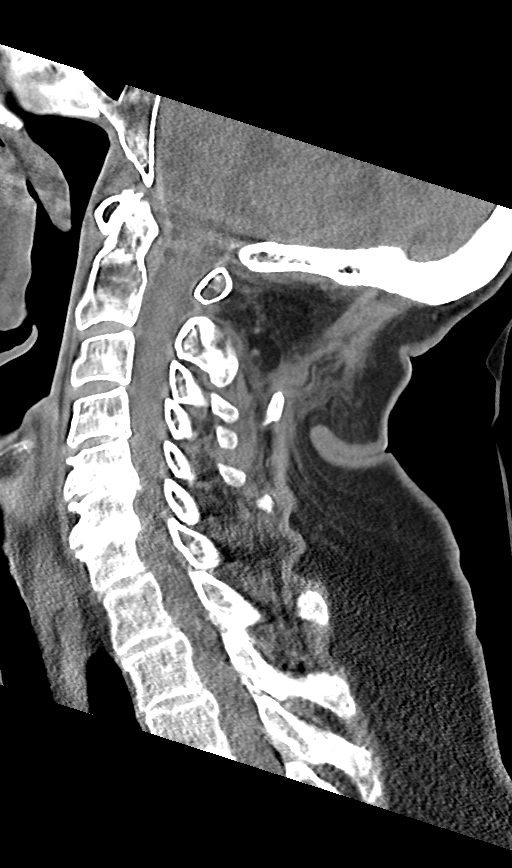
[im 35/69  bone]
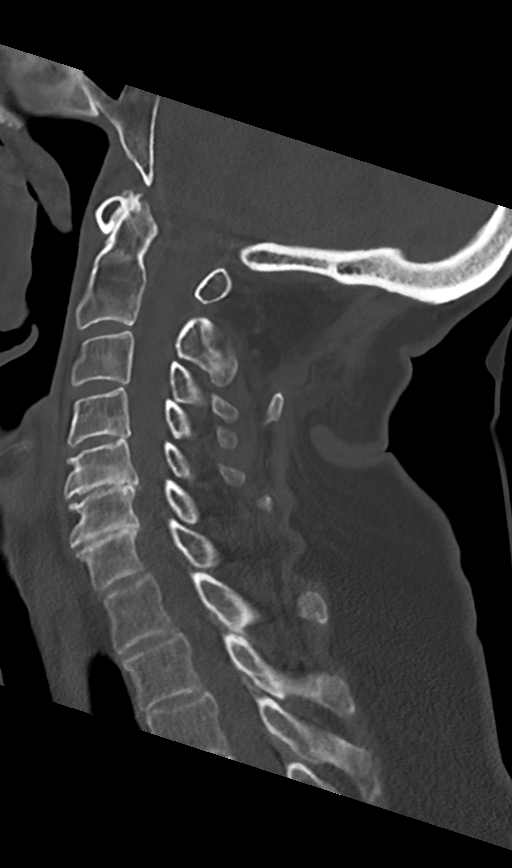
[im 40/69  bone]
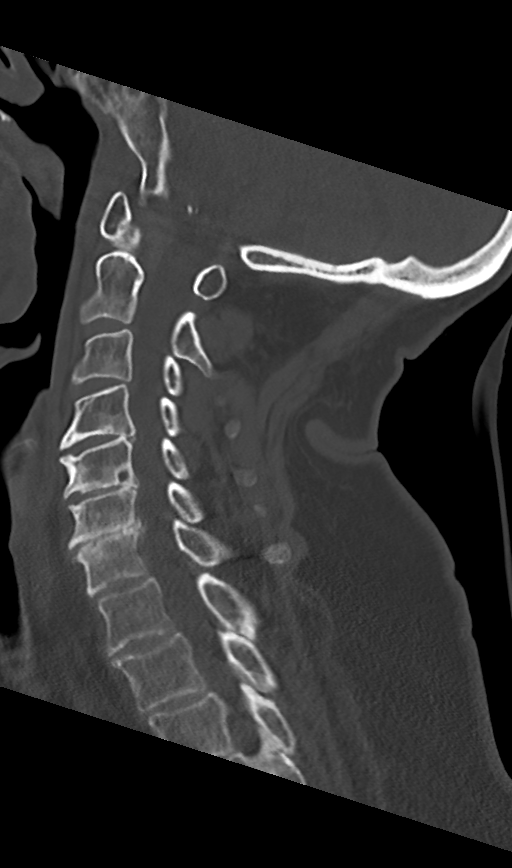
[im 46/69  bone]
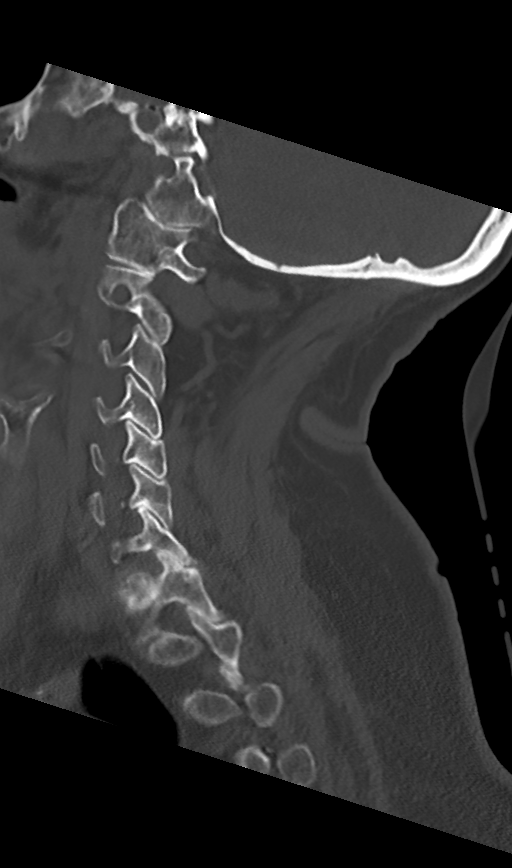

[Series 9: cor bone · coronal · 0.29mm/px · 3 of 69 slices shown]
[im 14/69  bone]
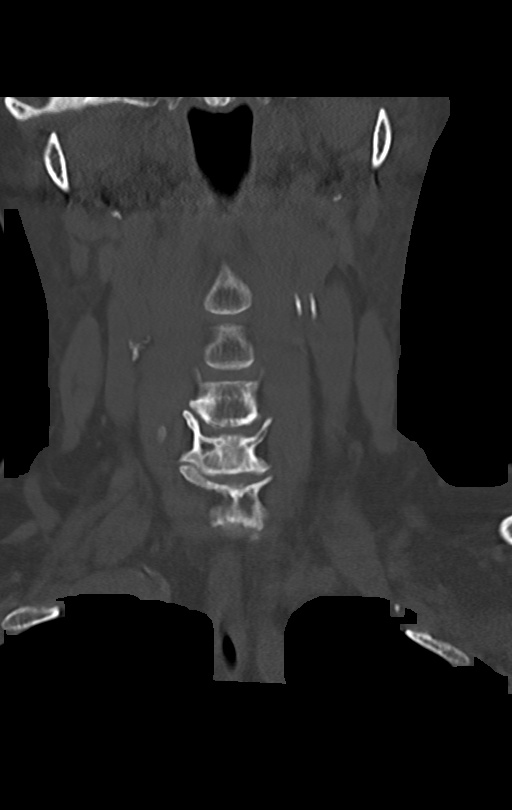
[im 28/69  bone]
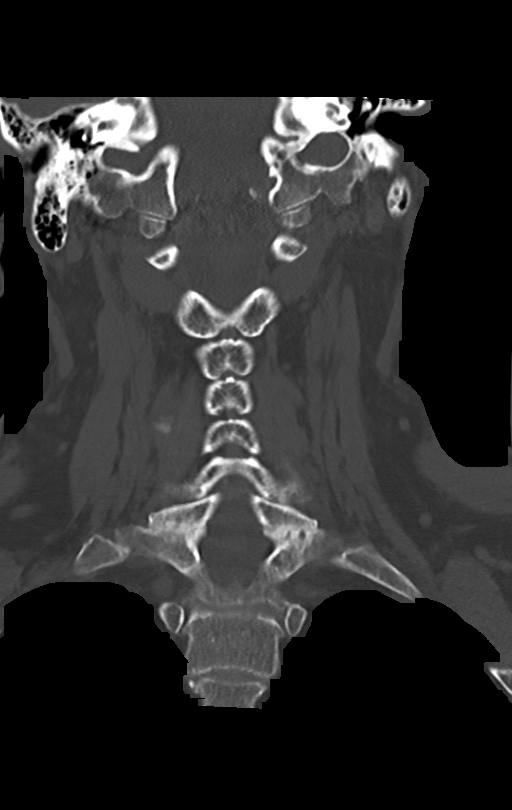
[im 41/69  bone]
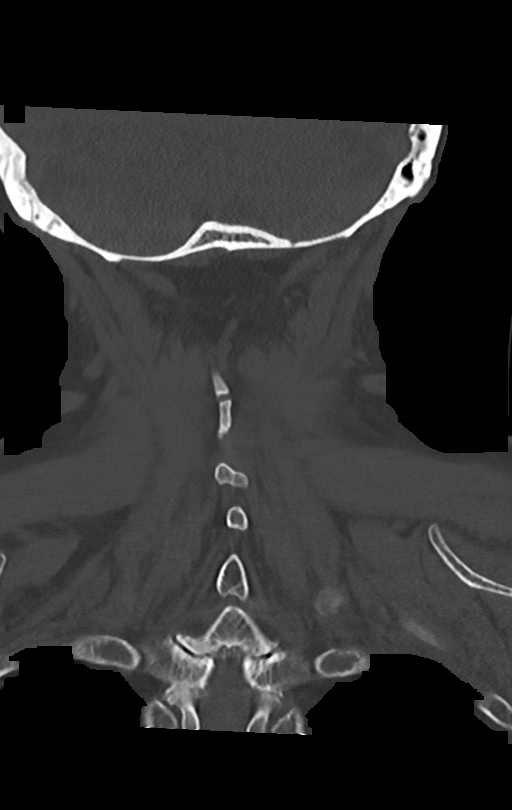

[Series 10: orthogonal axials · axial · 0.21mm/px · z∈[-205,-108]mm · 4 of 89 slices shown, 5 images]
[im 15/89  soft-tissue]
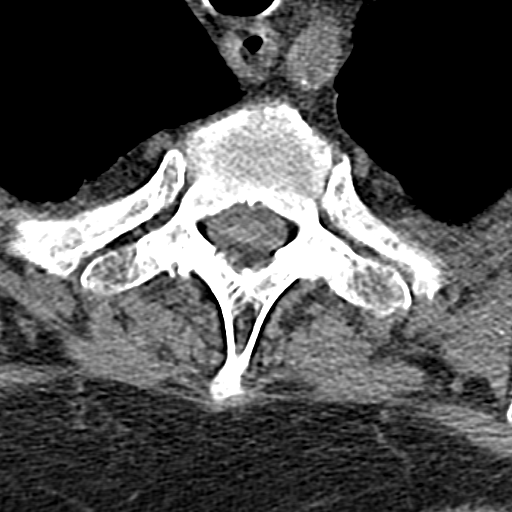
[im 15/89  bone]
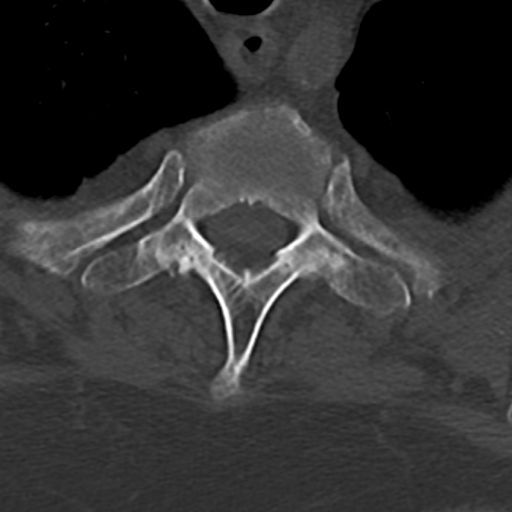
[im 30/89  bone]
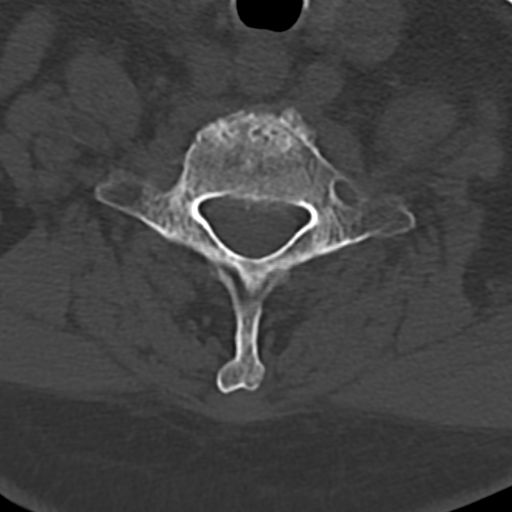
[im 59/89  bone]
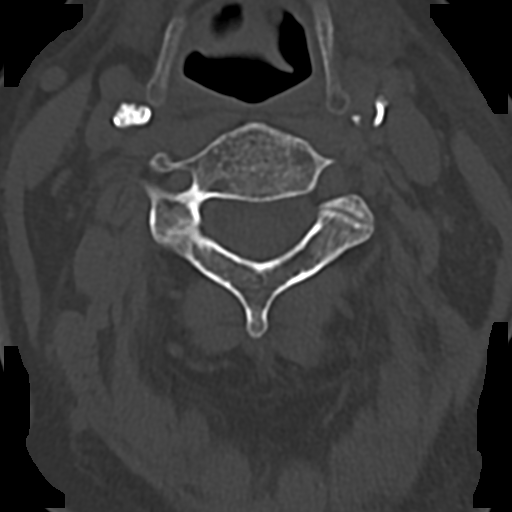
[im 74/89  bone]
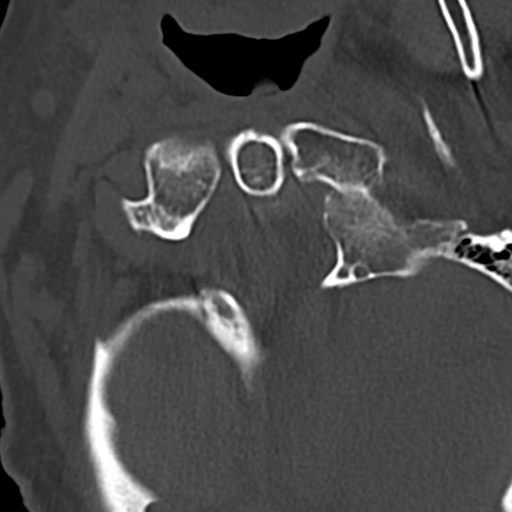

[12 of 33 positions shown; findings below may reference images not displayed]

FINDINGS: Alignment: No significant listhesis.

Skull base and vertebrae: No acute fracture. Degenerative endplate
irregularity.

Soft tissues and spinal canal: No prevertebral fluid or swelling. No
visible canal hematoma.

Disc levels: Multilevel degenerative changes are present including
disc space narrowing, endplate osteophytes, and facet and
uncovertebral hypertrophy.

Upper chest: Included lung apices are clear.

Other: Calcified plaque at the common carotid bifurcations.
IMPRESSION: No acute cervical spine fracture.

## 2021-12-16 IMAGING — DX DG PORTABLE PELVIS
1 series · 1 of 1 positions shown · non-contrast
Comparison: CT abdomen and pelvis [DATE].

CLINICAL DATA: Fall.  Head injury.  Altered mental status.

EXAM:
PORTABLE PELVIS 1-2 VIEWS

[pelvis ap]
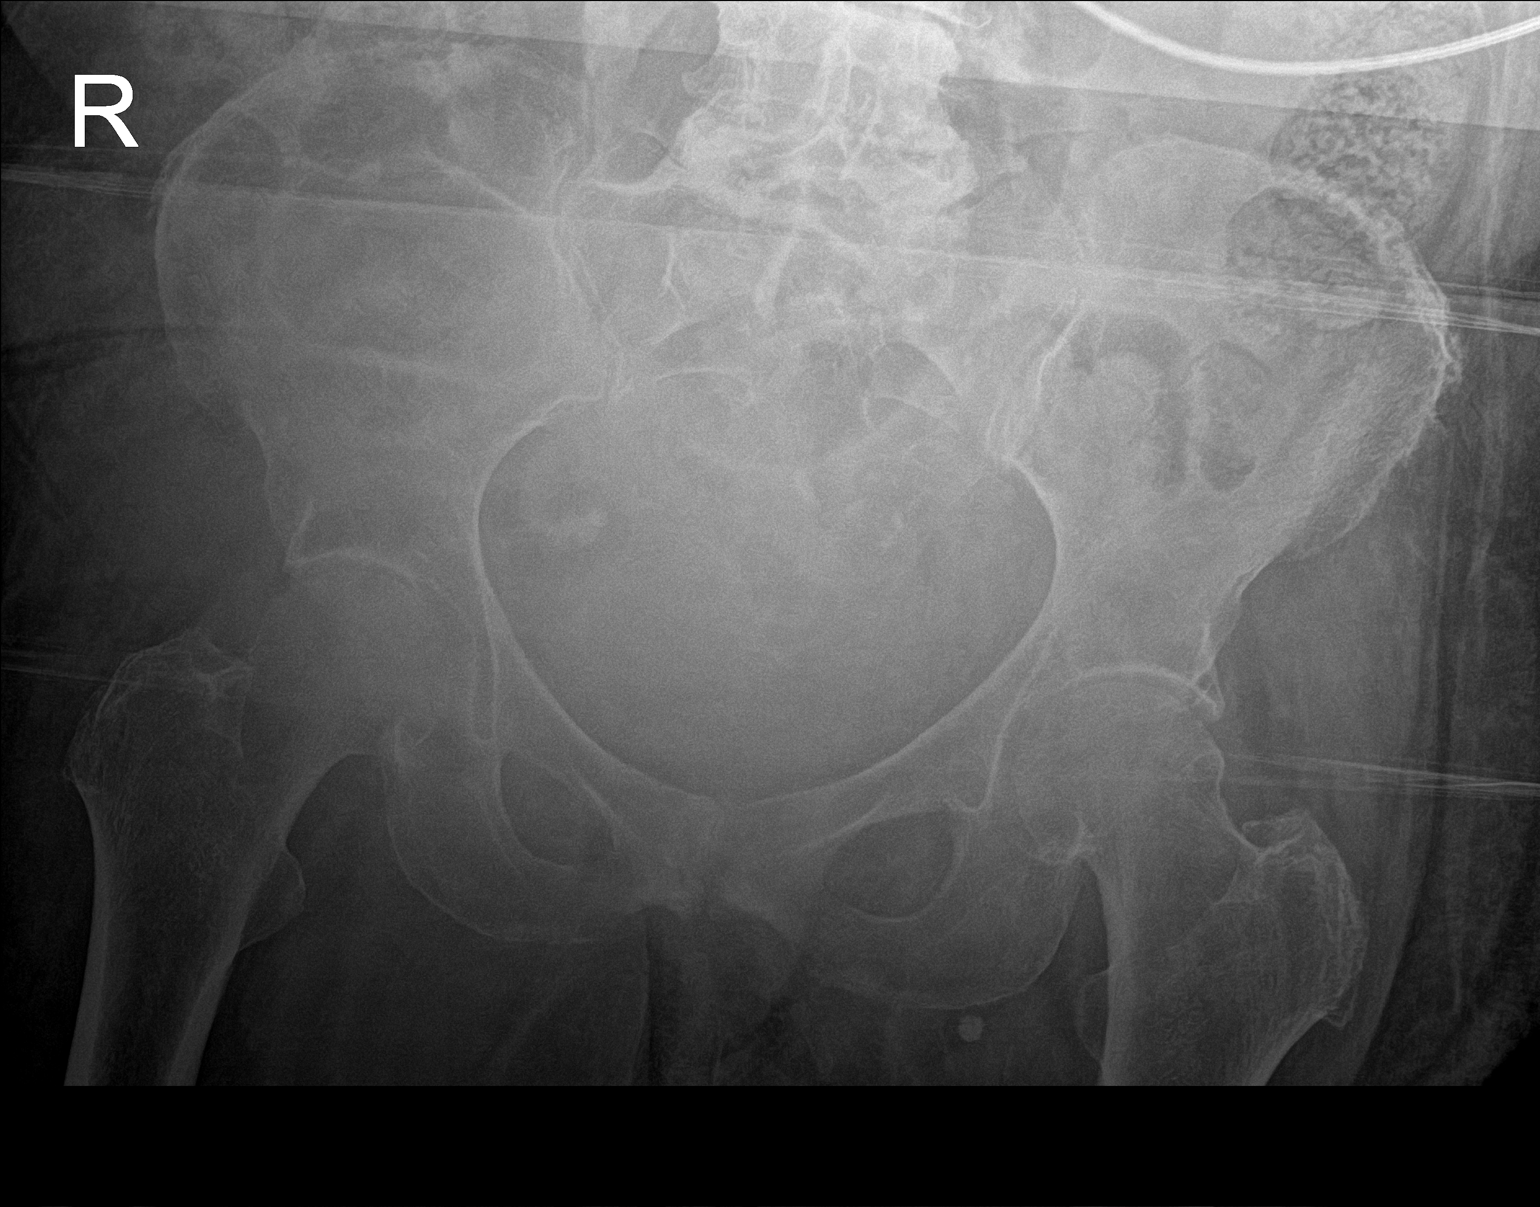

[1 of 1 positions shown; findings below may reference images not displayed]

FINDINGS: [K5] hours. The bones are diffusely demineralized. No evidence of
acute fracture, dislocation or bone destruction. Stable mild
degenerative changes of the hips and sacroiliac joints. The soft
tissues appear unremarkable.
IMPRESSION: No evidence of acute pelvic fracture or dislocation.

## 2021-12-16 MED ORDER — ONDANSETRON HCL 4 MG/2ML IJ SOLN
4.0000 mg | Freq: Once | INTRAMUSCULAR | Status: AC
  Administered 2021-12-16: 4 mg via INTRAVENOUS
  Filled 2021-12-16: qty 2

## 2021-12-16 MED ORDER — DILTIAZEM HCL ER COATED BEADS 240 MG PO CP24: 240.0000 mg | ORAL_CAPSULE | Freq: Once | ORAL | Status: DC

## 2021-12-16 MED ORDER — LACTATED RINGERS IV BOLUS
500.0000 mL | Freq: Once | INTRAVENOUS | Status: AC
  Administered 2021-12-16: 500 mL via INTRAVENOUS

## 2021-12-16 NOTE — ED Provider Notes (Signed)
[facility] EMERGENCY DEPARTMENT Provider Note   CSN: [id number] Arrival date & time: 12/16/21  3474     History  Chief Complaint  Patient presents with   Kayla Zuniga    Kayla Zuniga is a 88 y.o. female.  Patient is an 88 year old female with a history of hypertension, diabetes, macular degeneration, CKD, A-fib on Eliquis and hypothyroidism who is presenting today as a level 2 trauma after a fall at home.  Patient reports she was getting up to go to the bathroom and tripped over her stool next to her bed.  She denies feeling dizzy or unwell when she got out of bed.  There is a portion that she does not quite remember.  She reports feeling sleepy but denies feeling dizzy or visual changes.  She denies any localized pain but did report neck pain to paramedics.  She denies any numbness or tingling in her arms or legs.  She last took her medications yesterday.  She reports yesterday as far she remembers was a normal day.  She has been eating and drinking normally and denied feeling ill.  She denies any chest pain, shortness of breath, palpitations, abdominal pain but did complain of feeling mildly nauseated.  The history is provided by the patient.  Fall       Home Medications Prior to Admission medications   Medication Sig Start Date End Date Taking? Authorizing Provider  acetaminophen (TYLENOL) 500 MG tablet Take 500 mg by mouth as needed.   Yes [provider]  apixaban (ELIQUIS) 5 MG TABS tablet TAKE 1 TABLET BY MOUTH  TWICE DAILY Patient taking differently: Take 5 mg by mouth 2 (two) times daily. 09/13/21  Yes Chilton Si, MD  atorvastatin (LIPITOR) 10 MG tablet Take 10 mg by mouth daily. 02/20/17  Yes [provider]  bismuth subsalicylate (PEPTO BISMOL) 262 MG/15ML suspension Take 15 mLs by mouth 2 (two) times daily as needed for diarrhea or loose stools.   Yes [provider]  Cholecalciferol (VITAMIN D3) 50 MCG (2000 UT) TABS Take  2,000 Units by mouth daily.   Yes [provider]  denosumab (PROLIA) 60 MG/ML SOSY injection Inject 60 mg into the skin every 6 (six) months.   Yes [provider]  escitalopram (LEXAPRO) 10 MG tablet Take 10 mg by mouth daily.   Yes [provider]  furosemide (LASIX) 20 MG tablet Take 1 tablet (20 mg total) by mouth daily. 04/24/17  Yes Newman Nip, NP  levothyroxine (SYNTHROID) 25 MCG tablet Take 25-50 mcg by mouth See admin instructions. Take 25 mcg by mouth in the morning before breakfast on Sunday and 50 mcg on Mon/Tues/Wed/Thurs/Fri/Sat   Yes [provider]  Multiple Vitamins-Minerals (OCUVITE EYE HEALTH FORMULA) CAPS Take 1 capsule by mouth daily with breakfast.   Yes [provider]  nebivolol (BYSTOLIC) 10 MG tablet Take 10 mg by mouth at bedtime.   Yes [provider]  Omega-3 Fatty Acids (FISH OIL) 1000 MG CAPS Take 1,000 mg by mouth daily.   Yes [provider]  potassium chloride (KLOR-CON 10) 10 MEQ tablet Take 10 mEq by mouth at bedtime.   Yes [provider]  valsartan (DIOVAN) 160 MG tablet Take 160 mg by mouth at bedtime.   Yes [provider]  diltiazem (CARDIZEM CD) 240 MG 24 hr capsule Take 1 capsule (240 mg total) by mouth daily. Patient not taking: Reported on 12/04/2021 02/24/21   Chilton Si, MD  HYDROcodone-acetaminophen (  NORCO) 5-325 MG tablet Take 1 tablet by mouth every 6 (six) hours as needed. Patient not taking: Reported on 12/16/2021 12/04/21   Pricilla Loveless, MD  lidocaine (LIDODERM) 5 % Place 1 patch onto the skin daily. Remove & Discard patch within 12 hours or as directed by MD Patient not taking: Reported on 12/16/2021 12/04/21   Pricilla Loveless, MD  valsartan (DIOVAN) 320 MG tablet Take 1 tablet (320 mg total) by mouth daily. Patient not taking: Reported on 12/04/2021 04/03/17   Newman Nip, NP      Allergies    Penicillins, Sulfa antibiotics, Atenolol, Ibandronic acid,  Alendronate, Amlodipine besylate, and Prolia [denosumab]    Review of Systems   Review of Systems  Physical Exam Updated Vital Signs BP (!) 100/58   Pulse 81   Temp (!) 97 F (36.1 C)   Resp 20   SpO2 96%  Physical Exam Vitals and nursing note reviewed.  Constitutional:      General: She is in acute distress.     Appearance: She is well-developed.  HENT:     Head: Normocephalic and atraumatic.     Mouth/Throat:     Mouth: Mucous membranes are dry.  Eyes:     Pupils: Pupils are equal, round, and reactive to light.  Neck:     Comments: Currently in a Aspen collar Cardiovascular:     Rate and Rhythm: Tachycardia present. Rhythm irregularly irregular.     Heart sounds: Normal heart sounds. No murmur heard.    No friction rub.  Pulmonary:     Effort: Pulmonary effort is normal.     Breath sounds: Normal breath sounds. No wheezing or rales.  Abdominal:     General: Bowel sounds are normal. There is no distension.     Palpations: Abdomen is soft.     Tenderness: There is no abdominal tenderness. There is no guarding or rebound.  Musculoskeletal:        General: Normal range of motion.     Cervical back: Tenderness present. Muscular tenderness present.     Right lower leg: Edema present.     Left lower leg: Edema present.     Comments: 1+ pitting edema at the ankles.  Able to range both lower extremities without significant pain.  Full range of motion of upper extremities without pain  Skin:    General: Skin is warm and dry.     Coloration: Skin is pale.     Findings: No rash.  Neurological:     Mental Status: She is alert and oriented to person, place, and time.     Cranial Nerves: No cranial nerve deficit.     Comments: Patient is sleepy but wakes up to voice and answers questions appropriately.  5 out of 5 strength in upper and lower extremities with normal sensation.  No notable pronator drift in any extremity.  Extraocular movements are intact visual fields are intact.   No slurred speech or aphasia  Psychiatric:        Behavior: Behavior normal.     ED Results / Procedures / Treatments   Labs (all labs ordered are listed, but only abnormal results are displayed) Labs Reviewed  CBC WITH DIFFERENTIAL/PLATELET - Abnormal; Notable for the following components:      Result Value   WBC 22.9 (*)    RBC 3.37 (*)    HCT 35.7 (*)    MCV 105.9 (*)    MCH 36.5 (*)    Neutro Abs  20.4 (*)    Monocytes Absolute 1.4 (*)    Abs Immature Granulocytes 0.27 (*)    All other components within normal limits  BASIC METABOLIC PANEL - Abnormal; Notable for the following components:   Glucose, Bld 139 (*)    Creatinine, Ser 1.18 (*)    GFR, Estimated 45 (*)    All other components within normal limits  PROTIME-INR - Abnormal; Notable for the following components:   Prothrombin Time 20.9 (*)    INR 1.8 (*)    All other components within normal limits  I-STAT CHEM 8, ED - Abnormal; Notable for the following components:   Glucose, Bld 139 (*)    Calcium, Ion 1.00 (*)    All other components within normal limits  APTT  I-STAT CHEM 8, ED    EKG EKG Interpretation  Date/Time:  Friday December 16 2021 08:24:54 EDT Ventricular Rate:  113 PR Interval:  158 QRS Duration: 79 QT Interval:  298 QTC Calculation: 414 R Axis:   64 Text Interpretation: Atrial fibrillation Paired ventricular premature complexes Consider anterior infarct Nonspecific repol abnormality, inferior leads Artifact Confirmed by Gwyneth SproutPlunkett, Param Capri ([id number]) on 12/16/2021 8:36:22 AM  Radiology CT Cervical Spine Wo Contrast  Result Date: 12/16/2021 CLINICAL DATA:  Neck trauma (Age >= 65y) EXAM: CT CERVICAL SPINE WITHOUT CONTRAST TECHNIQUE: Multidetector CT imaging of the cervical spine was performed without intravenous contrast. Multiplanar CT image reconstructions were also generated. RADIATION DOSE REDUCTION: This exam was performed according to the departmental dose-optimization program which includes  automated exposure control, adjustment of the mA and/or kV according to patient size and/or use of iterative reconstruction technique. COMPARISON:  None Available. FINDINGS: Alignment: No significant listhesis. Skull base and vertebrae: No acute fracture. Degenerative endplate irregularity. Soft tissues and spinal canal: No prevertebral fluid or swelling. No visible canal hematoma. Disc levels: Multilevel degenerative changes are present including disc space narrowing, endplate osteophytes, and facet and uncovertebral hypertrophy. Upper chest: Included lung apices are clear. Other: Calcified plaque at the common carotid bifurcations. IMPRESSION: No acute cervical spine fracture. Electronically Signed   By: Guadlupe SpanishPraneil  Patel M.D.   On: 12/16/2021 09:11   CT Head Wo Contrast  Result Date: 12/16/2021 CLINICAL DATA:  Head trauma, minor (Age >= 65y) EXAM: CT HEAD WITHOUT CONTRAST TECHNIQUE: Contiguous axial images were obtained from the base of the skull through the vertex without intravenous contrast. RADIATION DOSE REDUCTION: This exam was performed according to the departmental dose-optimization program which includes automated exposure control, adjustment of the mA and/or kV according to patient size and/or use of iterative reconstruction technique. COMPARISON:  None Available. FINDINGS: Brain: There is no acute intracranial hemorrhage, mass effect, or edema. Gray-white differentiation is preserved. There is no extra-axial fluid collection. Prominence of the ventricles and sulci reflects parenchymal volume loss. Patchy hypoattenuation in the supratentorial white matter is nonspecific but may reflect mild chronic microvascular ischemic changes. Vascular: There is atherosclerotic calcification at the skull base. Skull: Calvarium is unremarkable. Sinuses/Orbits: No acute finding. Other: None. IMPRESSION: No evidence of acute intracranial injury. Chronic/nonemergent findings detailed above. Electronically Signed   By:  Guadlupe SpanishPraneil  Patel M.D.   On: 12/16/2021 09:01   DG Pelvis Portable  Result Date: 12/16/2021 CLINICAL DATA:  Fall.  Head injury.  Altered mental status. EXAM: PORTABLE PELVIS 1-2 VIEWS COMPARISON:  CT abdomen and pelvis 12/04/2021. FINDINGS: 0831 hours. The bones are diffusely demineralized. No evidence of acute fracture, dislocation or bone destruction. Stable mild degenerative changes of the hips and sacroiliac joints.  The soft tissues appear unremarkable. IMPRESSION: No evidence of acute pelvic fracture or dislocation. Electronically Signed   By: Carey Bullocks M.D.   On: 12/16/2021 08:50   DG Chest Port 1 View  Result Date: 12/16/2021 CLINICAL DATA:  Provided history: Fall, head injury, altered mental status. EXAM: PORTABLE CHEST 1 VIEW COMPARISON:  Prior chest radiographs 09/13/2021. FINDINGS: Heart size at the upper limits of normal (when accounting for AP technique). Ill-defined opacity within the medial right upper lobe. Additional opacity within the left lung base. Small left pleural effusion. No evidence of pneumothorax. No acute bony abnormality identified. Degenerative changes of the spine. Levocurvature of the lower thoracic and upper lumbar spine. IMPRESSION: Opacities within the medial right upper lobe and left lung base, which may reflect atelectasis or aspiration/pneumonia. Small left pleural effusion. Electronically Signed   By: Jackey Loge D.O.   On: 12/16/2021 08:48    Procedures Procedures    Medications Ordered in ED Medications  ondansetron (ZOFRAN) injection 4 mg (4 mg Intravenous Given 12/16/21 0910)  lactated ringers bolus 500 mL (500 mLs Intravenous New Bag/Given 12/16/21 0912)    ED Course/ Medical Decision Making/ A&P                           Medical Decision Making Amount and/or Complexity of Data Reviewed Independent Historian: EMS External Data Reviewed: notes. Labs: ordered. Decision-making details documented in ED Course. Radiology: ordered and independent  interpretation performed. Decision-making details documented in ED Course. ECG/medicine tests: ordered and independent interpretation performed. Decision-making details documented in ED Course.  Risk OTC drugs.   Pt with multiple medical problems and comorbidities and presenting today with a complaint that caries a high risk for morbidity and mortality.  Presenting today as a level 2 fall.  Patient had a head injury and is on Eliquis.  Patient's mental status has been slightly declined since being here as she is very sleepy but does awaken to voice.  Concern for concussion versus intracranial bleed.  Sounds like she tripped over a stool next to her bed and the fall was related to that and not from a medical issue such as cardiac or respiratory collapse.  She denies any dizziness.  Oxygen saturation here is within normal limits.  Patient is noted to be in atrial fibrillation today with a rate between 90 and 100.  She has not had her morning medications.  She denies being recently ill.  Information gained from prior medical records.  Also patient recently was in the emergency room and had an MRI done last week which was negative for cauda equina but she did get a prescription for hydrocodone.  She has not taken any of that today.  Labs and imaging are pending.  11:09 AM Since daughter is now present who has been staying with her overnight for the last month.  She reports that yesterday her mom did not eat any lunch or dinner because she had multiple episodes of diarrhea throughout the day but no vomiting.  The day before she did not have diarrhea.  However the rest of the story seems to be the same.  However daughter reports that she is not sure how long she laid on the floor because she did not hear her fall and her mom told her when she fell it was still dark outside.  When her alarm went off she heard her call her name and she went down and found her on  the floor.  Patient had a repeat blood pressure that  was lower at 96/56 with heart rate between 80 and 100.  We will give a fluid bolus.  I independently interpreted patient's labs and EKG today.  EKG unfortunately has significant artifact but appears to be in atrial fibrillation which is unchanged, CV see today with a leukocytosis of 22 stable hemoglobin of 12 and a normal platelet count.  Unclear if leukocytosis is related to recent diarrhea versus acute phase reaction from trauma. I have independently visualized and interpreted pt's images today. Head CT today without evidence of acute intracranial hemorrhage and cervical spine CT without acute fracture.  C-spine was cleared.  Pelvis film without acute fractures.  Chest x-ray appears to be a poor inspiratory film with poor aeration of the bases.  Radiology reports that the chest x-ray shows opacity within the medial right upper lobe and left lung base which may reflect atelectasis or aspiration.  Patient denies feeling short of breath has not recently been coughing or had any vomiting.  Suspect atelectatic process is more likely.  11:09 AM Blood pressure improved after 500 mL bolus.  Patient's BMP without acute findings.  Coags are to be expected with being on Eliquis with INR 1.8.  Speaking with patient and the daughter she had been on steroids recently which may be related to the leukocytosis were reactive from trauma and lying on the floor.  Patient has been afebrile here she has no abdominal pain lower suspicion for C. difficile as she only had 1 day of diarrhea and has not had recent antibiotics.  Will p.o. challenge the patient and ensure she can ambulate.  Otherwise feel that she would be safe for discharge home.  She has no social barriers affecting her care as she has good home support and is getting home services.  Pt was able to ambulate here.  She did have a soft stool but no frank diarrhea.  Will d/c home with daughter.        Final Clinical Impression(s) / ED Diagnoses Final diagnoses:   Fall, initial encounter  Concussion with unknown loss of consciousness status, initial encounter  Dehydration    Rx / DC Orders ED Discharge Orders     None         Gwyneth Sprout, MD 12/16/21 1109

## 2021-12-16 NOTE — Discharge Instructions (Signed)
She can take all her medication when getting home.  X-rays look normal and labs are all normal except for elevated white blood cell count.  She only had a soft stool here and if no liquid stool she does not need imodium.  If she starts having severe confusion, inability to wake up, high fever vomiting or other concerns please return to the emergency room as she would need a repeat head scan.  However head scan today is normal.

## 2021-12-16 NOTE — ED Triage Notes (Signed)
Pt here from home as a level 2 trauma after falling this am hitting her head , pt is on a blood thinner , c/o head and neck pain

## 2021-12-16 NOTE — Progress Notes (Signed)
Orthopedic Tech Progress Note Patient Details:  Kayla Zuniga 1934/01/19 [id number]  Patient ID: [name], female   DOB: [birth year], 88 y.o.   MRN: [id number] Level II; not needed at the moment.  [name] 12/16/2021, 8:26 AM

## 2021-12-20 ENCOUNTER — Other Ambulatory Visit: Payer: Self-pay | Admitting: Cardiovascular Disease

## 2021-12-20 DIAGNOSIS — I4819 Other persistent atrial fibrillation: Secondary | ICD-10-CM

## 2021-12-21 NOTE — Telephone Encounter (Signed)
Please review for refill. Thank you! 

## 2021-12-26 ENCOUNTER — Emergency Department (HOSPITAL_COMMUNITY): Payer: Medicare Other

## 2021-12-26 ENCOUNTER — Inpatient Hospital Stay (HOSPITAL_COMMUNITY)
Admission: EM | Admit: 2021-12-26 | Discharge: 2021-12-30 | DRG: 552 | Disposition: A | Discharge status: Skilled Nursing Facility | Payer: Medicare Other | Attending: Internal Medicine | Admitting: Internal Medicine

## 2021-12-26 ENCOUNTER — Encounter (HOSPITAL_COMMUNITY): Payer: Self-pay

## 2021-12-26 ENCOUNTER — Other Ambulatory Visit: Payer: Self-pay

## 2021-12-26 DIAGNOSIS — I10 Essential (primary) hypertension: Secondary | ICD-10-CM | POA: Diagnosis not present

## 2021-12-26 DIAGNOSIS — Z6831 Body mass index (BMI) 31.0-31.9, adult: Secondary | ICD-10-CM

## 2021-12-26 DIAGNOSIS — Z9842 Cataract extraction status, left eye: Secondary | ICD-10-CM

## 2021-12-26 DIAGNOSIS — N390 Urinary tract infection, site not specified: Secondary | ICD-10-CM

## 2021-12-26 DIAGNOSIS — M48061 Spinal stenosis, lumbar region without neurogenic claudication: Principal | ICD-10-CM | POA: Diagnosis present

## 2021-12-26 DIAGNOSIS — M810 Age-related osteoporosis without current pathological fracture: Secondary | ICD-10-CM | POA: Diagnosis present

## 2021-12-26 DIAGNOSIS — Z881 Allergy status to other antibiotic agents status: Secondary | ICD-10-CM

## 2021-12-26 DIAGNOSIS — Z8249 Family history of ischemic heart disease and other diseases of the circulatory system: Secondary | ICD-10-CM

## 2021-12-26 DIAGNOSIS — E039 Hypothyroidism, unspecified: Secondary | ICD-10-CM | POA: Diagnosis present

## 2021-12-26 DIAGNOSIS — E669 Obesity, unspecified: Secondary | ICD-10-CM | POA: Diagnosis present

## 2021-12-26 DIAGNOSIS — Z9049 Acquired absence of other specified parts of digestive tract: Secondary | ICD-10-CM

## 2021-12-26 DIAGNOSIS — E785 Hyperlipidemia, unspecified: Secondary | ICD-10-CM | POA: Diagnosis present

## 2021-12-26 DIAGNOSIS — M4800 Spinal stenosis, site unspecified: Secondary | ICD-10-CM | POA: Diagnosis present

## 2021-12-26 DIAGNOSIS — R32 Unspecified urinary incontinence: Secondary | ICD-10-CM | POA: Diagnosis present

## 2021-12-26 DIAGNOSIS — I4819 Other persistent atrial fibrillation: Secondary | ICD-10-CM | POA: Diagnosis present

## 2021-12-26 DIAGNOSIS — R531 Weakness: Secondary | ICD-10-CM | POA: Diagnosis not present

## 2021-12-26 DIAGNOSIS — H3530 Unspecified macular degeneration: Secondary | ICD-10-CM | POA: Diagnosis present

## 2021-12-26 DIAGNOSIS — M549 Dorsalgia, unspecified: Secondary | ICD-10-CM | POA: Diagnosis not present

## 2021-12-26 DIAGNOSIS — Z79899 Other long term (current) drug therapy: Secondary | ICD-10-CM

## 2021-12-26 DIAGNOSIS — E1122 Type 2 diabetes mellitus with diabetic chronic kidney disease: Secondary | ICD-10-CM | POA: Diagnosis present

## 2021-12-26 DIAGNOSIS — M5441 Lumbago with sciatica, right side: Secondary | ICD-10-CM | POA: Diagnosis present

## 2021-12-26 DIAGNOSIS — F0390 Unspecified dementia without behavioral disturbance: Secondary | ICD-10-CM | POA: Diagnosis present

## 2021-12-26 DIAGNOSIS — I129 Hypertensive chronic kidney disease with stage 1 through stage 4 chronic kidney disease, or unspecified chronic kidney disease: Secondary | ICD-10-CM | POA: Diagnosis present

## 2021-12-26 DIAGNOSIS — Z888 Allergy status to other drugs, medicaments and biological substances status: Secondary | ICD-10-CM

## 2021-12-26 DIAGNOSIS — I4891 Unspecified atrial fibrillation: Principal | ICD-10-CM

## 2021-12-26 DIAGNOSIS — Z880 Allergy status to penicillin: Secondary | ICD-10-CM

## 2021-12-26 DIAGNOSIS — R296 Repeated falls: Secondary | ICD-10-CM | POA: Diagnosis present

## 2021-12-26 DIAGNOSIS — R000 Tachycardia, unspecified: Secondary | ICD-10-CM | POA: Diagnosis present

## 2021-12-26 DIAGNOSIS — M545 Low back pain, unspecified: Secondary | ICD-10-CM | POA: Diagnosis not present

## 2021-12-26 DIAGNOSIS — W19XXXA Unspecified fall, initial encounter: Secondary | ICD-10-CM | POA: Diagnosis present

## 2021-12-26 DIAGNOSIS — E7800 Pure hypercholesterolemia, unspecified: Secondary | ICD-10-CM | POA: Diagnosis present

## 2021-12-26 DIAGNOSIS — I959 Hypotension, unspecified: Secondary | ICD-10-CM | POA: Diagnosis present

## 2021-12-26 DIAGNOSIS — Z79890 Hormone replacement therapy (postmenopausal): Secondary | ICD-10-CM

## 2021-12-26 DIAGNOSIS — M4804 Spinal stenosis, thoracic region: Secondary | ICD-10-CM | POA: Diagnosis present

## 2021-12-26 DIAGNOSIS — N183 Chronic kidney disease, stage 3 unspecified: Secondary | ICD-10-CM | POA: Diagnosis present

## 2021-12-26 DIAGNOSIS — Z9841 Cataract extraction status, right eye: Secondary | ICD-10-CM

## 2021-12-26 DIAGNOSIS — R159 Full incontinence of feces: Secondary | ICD-10-CM | POA: Diagnosis present

## 2021-12-26 DIAGNOSIS — R300 Dysuria: Secondary | ICD-10-CM | POA: Diagnosis present

## 2021-12-26 DIAGNOSIS — Z66 Do not resuscitate: Secondary | ICD-10-CM | POA: Diagnosis present

## 2021-12-26 DIAGNOSIS — Z7901 Long term (current) use of anticoagulants: Secondary | ICD-10-CM

## 2021-12-26 DIAGNOSIS — Y92009 Unspecified place in unspecified non-institutional (private) residence as the place of occurrence of the external cause: Secondary | ICD-10-CM

## 2021-12-26 LAB — COMPREHENSIVE METABOLIC PANEL
ALT: 26 U/L (ref 0–44)
AST: 38 U/L (ref 15–41)
Albumin: 3.2 g/dL — ABNORMAL LOW (ref 3.5–5.0)
Alkaline Phosphatase: 34 U/L — ABNORMAL LOW (ref 38–126)
Anion gap: 10 (ref 5–15)
BUN: 13 mg/dL (ref 8–23)
CO2: 22 mmol/L (ref 22–32)
Calcium: 8.6 mg/dL — ABNORMAL LOW (ref 8.9–10.3)
Chloride: 106 mmol/L (ref 98–111)
Creatinine, Ser: 0.91 mg/dL (ref 0.44–1.00)
GFR, Estimated: >60 mL/min (ref >60)
Glucose, Bld: 126 mg/dL — ABNORMAL HIGH (ref 70–99)
Potassium: 4.4 mmol/L (ref 3.5–5.1)
Sodium: 138 mmol/L (ref 135–145)
Total Bilirubin: 1.9 mg/dL — ABNORMAL HIGH (ref 0.3–1.2)
Total Protein: 6.5 g/dL (ref 6.5–8.1)

## 2021-12-26 LAB — CBC WITH DIFFERENTIAL/PLATELET
Abs Immature Granulocytes: 0.04 10*3/uL (ref 0.00–0.07)
Basophils Absolute: 0 10*3/uL (ref 0.0–0.1)
Basophils Relative: 0 %
Eosinophils Absolute: 0 10*3/uL (ref 0.0–0.5)
Eosinophils Relative: 0 %
HCT: 38.1 % (ref 36.0–46.0)
Hemoglobin: 12.6 g/dL (ref 12.0–15.0)
Immature Granulocytes: 0 %
Lymphocytes Relative: 8 %
Lymphs Abs: 0.8 10*3/uL (ref 0.7–4.0)
MCH: 35.2 pg — ABNORMAL HIGH (ref 26.0–34.0)
MCHC: 33.1 g/dL (ref 30.0–36.0)
MCV: 106.4 fL — ABNORMAL HIGH (ref 80.0–100.0)
Monocytes Absolute: 0.8 10*3/uL (ref 0.1–1.0)
Monocytes Relative: 7 %
Neutro Abs: 8.6 10*3/uL — ABNORMAL HIGH (ref 1.7–7.7)
Neutrophils Relative %: 85 %
Platelets: 156 10*3/uL (ref 150–400)
RBC: 3.58 MIL/uL — ABNORMAL LOW (ref 3.87–5.11)
RDW: 13.8 % (ref 11.5–15.5)
WBC: 10.3 10*3/uL (ref 4.0–10.5)
nRBC: 0 % (ref 0.0–0.2)

## 2021-12-26 LAB — URINALYSIS, ROUTINE W REFLEX MICROSCOPIC
Bilirubin Urine: NEGATIVE
Glucose, UA: NEGATIVE mg/dL
Ketones, ur: NEGATIVE mg/dL
Leukocytes,Ua: NEGATIVE
Nitrite: POSITIVE — AB
Protein, ur: NEGATIVE mg/dL
Specific Gravity, Urine: 1.008 (ref 1.005–1.030)
pH: 7 (ref 5.0–8.0)

## 2021-12-26 LAB — LACTIC ACID, PLASMA
Lactic Acid, Venous: 1.7 mmol/L (ref 0.5–1.9)
Lactic Acid, Venous: 1.5 mmol/L (ref 0.5–1.9)

## 2021-12-26 LAB — TROPONIN I (HIGH SENSITIVITY)
Troponin I (High Sensitivity): 7 ng/L (ref <18)
Troponin I (High Sensitivity): 7 ng/L (ref <18)

## 2021-12-26 LAB — PROTIME-INR
INR: 1.5 — ABNORMAL HIGH (ref 0.8–1.2)
Prothrombin Time: 17.7 seconds — ABNORMAL HIGH (ref 11.4–15.2)

## 2021-12-26 LAB — LIPASE, BLOOD: Lipase: 26 U/L (ref 11–51)

## 2021-12-26 LAB — BRAIN NATRIURETIC PEPTIDE: B Natriuretic Peptide: 203.5 pg/mL — ABNORMAL HIGH (ref 0.0–100.0)

## 2021-12-26 MED ORDER — LEVOTHYROXINE SODIUM 25 MCG PO TABS: 25.0000 ug | ORAL_TABLET | ORAL | Status: DC

## 2021-12-26 MED ORDER — ACETAMINOPHEN 325 MG PO TABS: 650.0000 mg | ORAL_TABLET | Freq: Four times a day (QID) | ORAL | Status: DC | PRN

## 2021-12-26 MED ORDER — DILTIAZEM HCL 25 MG/5ML IV SOLN
10.0000 mg | Freq: Once | INTRAVENOUS | Status: AC
  Administered 2021-12-26: 10 mg via INTRAVENOUS
  Filled 2021-12-26: qty 5

## 2021-12-26 MED ORDER — NEBIVOLOL HCL 10 MG PO TABS
10.0000 mg | ORAL_TABLET | Freq: Every day | ORAL | Status: DC
  Administered 2021-12-26 – 2021-12-29 (×4): 10 mg via ORAL
  Filled 2021-12-26 (×5): qty 1

## 2021-12-26 MED ORDER — ONDANSETRON HCL 4 MG PO TABS: 4.0000 mg | ORAL_TABLET | Freq: Four times a day (QID) | ORAL | Status: DC | PRN

## 2021-12-26 MED ORDER — METHYLPREDNISOLONE 4 MG PO TBPK
4.0000 mg | ORAL_TABLET | ORAL | Status: AC
  Administered 2021-12-26: 4 mg via ORAL

## 2021-12-26 MED ORDER — ESCITALOPRAM OXALATE 10 MG PO TABS
10.0000 mg | ORAL_TABLET | Freq: Every day | ORAL | Status: DC
  Administered 2021-12-27 – 2021-12-30 (×4): 10 mg via ORAL
  Filled 2021-12-26 (×4): qty 1

## 2021-12-26 MED ORDER — METHYLPREDNISOLONE 4 MG PO TBPK
4.0000 mg | ORAL_TABLET | Freq: Three times a day (TID) | ORAL | Status: AC
  Administered 2021-12-27 (×3): 4 mg via ORAL

## 2021-12-26 MED ORDER — SODIUM CHLORIDE 0.9 % IV SOLN
1.0000 g | INTRAVENOUS | Status: DC
  Administered 2021-12-26 – 2021-12-27 (×2): 1 g via INTRAVENOUS
  Filled 2021-12-26 (×2): qty 10

## 2021-12-26 MED ORDER — APIXABAN 5 MG PO TABS
5.0000 mg | ORAL_TABLET | Freq: Two times a day (BID) | ORAL | Status: DC
  Administered 2021-12-26 – 2021-12-30 (×8): 5 mg via ORAL
  Filled 2021-12-26 (×8): qty 1

## 2021-12-26 MED ORDER — ATORVASTATIN CALCIUM 10 MG PO TABS
10.0000 mg | ORAL_TABLET | Freq: Every day | ORAL | Status: DC
  Administered 2021-12-26 – 2021-12-30 (×5): 10 mg via ORAL
  Filled 2021-12-26 (×5): qty 1

## 2021-12-26 MED ORDER — LEVOTHYROXINE SODIUM 50 MCG PO TABS
50.0000 ug | ORAL_TABLET | ORAL | Status: DC
  Administered 2021-12-27 – 2021-12-30 (×4): 50 ug via ORAL
  Filled 2021-12-26 (×4): qty 1

## 2021-12-26 MED ORDER — DILTIAZEM HCL 30 MG PO TABS
30.0000 mg | ORAL_TABLET | Freq: Once | ORAL | Status: AC
Start: 2021-12-26 — End: 2021-12-26
  Administered 2021-12-26: 30 mg via ORAL
  Filled 2021-12-26: qty 1

## 2021-12-26 MED ORDER — METHYLPREDNISOLONE 4 MG PO TBPK
4.0000 mg | ORAL_TABLET | Freq: Four times a day (QID) | ORAL | Status: DC
  Administered 2021-12-28 – 2021-12-30 (×8): 4 mg via ORAL

## 2021-12-26 MED ORDER — ACETAMINOPHEN 650 MG RE SUPP: 650.0000 mg | Freq: Four times a day (QID) | RECTAL | Status: DC | PRN

## 2021-12-26 MED ORDER — METHYLPREDNISOLONE 4 MG PO TBPK
8.0000 mg | ORAL_TABLET | Freq: Every evening | ORAL | Status: AC
  Administered 2021-12-27: 8 mg via ORAL
  Filled 2021-12-26: qty 21

## 2021-12-26 MED ORDER — METHYLPREDNISOLONE 4 MG PO TBPK
8.0000 mg | ORAL_TABLET | Freq: Every evening | ORAL | Status: AC
  Administered 2021-12-26: 8 mg via ORAL

## 2021-12-26 MED ORDER — METHYLPREDNISOLONE 4 MG PO TBPK
8.0000 mg | ORAL_TABLET | Freq: Every morning | ORAL | Status: AC
  Administered 2021-12-26: 8 mg via ORAL
  Filled 2021-12-26: qty 21

## 2021-12-26 MED ORDER — HYDROMORPHONE HCL 1 MG/ML IJ SOLN
0.5000 mg | INTRAMUSCULAR | Status: DC | PRN
  Administered 2021-12-29: 1 mg via INTRAVENOUS
  Filled 2021-12-26: qty 1

## 2021-12-26 MED ORDER — ONDANSETRON HCL 4 MG/2ML IJ SOLN: 4.0000 mg | Freq: Four times a day (QID) | INTRAMUSCULAR | Status: DC | PRN

## 2021-12-26 MED ORDER — SODIUM CHLORIDE 0.9 % IV BOLUS
500.0000 mL | Freq: Once | INTRAVENOUS | Status: AC
  Administered 2021-12-26: 500 mL via INTRAVENOUS

## 2021-12-26 MED ORDER — FENTANYL CITRATE PF 50 MCG/ML IJ SOSY
50.0000 ug | PREFILLED_SYRINGE | Freq: Once | INTRAMUSCULAR | Status: AC
  Administered 2021-12-26: 50 ug via INTRAVENOUS
  Filled 2021-12-26: qty 1

## 2021-12-26 MED ORDER — OXYCODONE HCL 5 MG PO TABS
5.0000 mg | ORAL_TABLET | ORAL | Status: DC | PRN
  Administered 2021-12-26 – 2021-12-27 (×2): 5 mg via ORAL
  Filled 2021-12-26 (×2): qty 1

## 2021-12-26 NOTE — H&P (Signed)
History and Physical    Kayla Zuniga YNW:[id number] DOB: September 10, 1933 DOA: 12/26/2021  PCP: Laurann Montana, MD  Patient coming from: home  I have personally briefly reviewed patient's old medical records in [facility]  Chief Complaint: back pain  HPI: Kayla Zuniga is a 88 y.o. female with medical history significant of atrial fibrillation on anticoagulation, hypertension, macular degeneration, hypothyroidism who lives at home with her daughter.  She was reportedly ambulating with a walker.  Her daughter reports that for the past 1 to 2 months she has had worsening lower extremity weakness.  Approximately a month ago she developed lower back pain.  This is radiating down into her legs.  The patient reports that she has noticed some numbness in her legs bilaterally, but cannot tell me how long this has been present.  Daughter is also noted bowel and urinary incontinence.  She helps clean the patient every time she uses the bathroom.  The patient is at home by herself while her daughter works during the day.  Her daughter does come in every day at lunch to check in on her.  She has noted a progressive decline in the patient's functional status to the point that she can no longer ambulate.  She has not had any fever.  No cough or shortness of breath.  No vomiting.  She does have alternating constipation and diarrhea.  Her p.o. intake has been poor.  She was seen by her primary care physician today where she was noted to be hypotensive and tachycardic.  She was sent to the ER for evaluation.  ED Course: On evaluation in the emergency room, blood pressure was noted to be stable, but she was noted to be tachycardic with a heart rate in the 140s in atrial fibrillation.  Chest x-ray did not show any signs of pneumonia.  CT of the lumbar spine was repeated due to persistent back pain and she was not noted to have any acute traumatic changes.  Urinalysis indicated possible infection.  Review of  Systems: As per HPI otherwise 10 point review of systems negative.    Past Medical History:  Diagnosis Date   Afib [facility]) 2018   Sebastian Ache, NP--> Dr Idelle Jo   Chronic kidney disease, stage 3 (HCC)    GFR 47 borderline elevated phosphorus, Dr Lowell Guitar, no longer follows   Chronic urticaria    Diabetes mellitus without complication (HCC)    Essential hypertension 09/19/2017   Hypercholesterolemia    Hypertension    Hypothyroidism 10/2019   Macular degeneration    Osteoporosis    intolerant of biphosphates, on Prolia since 2019   Retinal hemorrhage    left eye, Dr Allyne Gee, 2017, improved with injections   Vitamin B12 deficiency     Past Surgical History:  Procedure Laterality Date   BUNIONECTOMY     CARDIOVERSION N/A 04/13/2017   Procedure: CARDIOVERSION;  Surgeon: Chrystie Nose, MD;  Location: [facility] ENDOSCOPY;  Service: Cardiovascular;  Laterality: N/A;   CATARACT EXTRACTION, BILATERAL  2016   Dr Elmer Picker, 11/16 and 12/16   CHOLECYSTECTOMY  1973   DILATION AND CURETTAGE, DIAGNOSTIC / THERAPEUTIC  1980   TONSILLECTOMY     age 68    Social History:  reports that she has never smoked. She has never used smokeless tobacco. She reports current alcohol use. She reports that she does not use drugs.  Allergies  Allergen Reactions   Penicillins Hives, Itching and Rash  Sulfa Antibiotics Itching and Rash   Atenolol Other (See Comments)    Dizziness- made the patient "tired"   Ibandronic Acid Other (See Comments)    Hallucinations   Alendronate Other (See Comments)    Muscle/bone pain   Amlodipine Besylate Swelling and Other (See Comments)    Edema at higher doses   Prolia [Denosumab] Other (See Comments)    "Hallucinations," but the patient received this in 2023    Family History  Problem Relation Age of Onset   Liver disease Sister    Heart disease Maternal Grandmother    Alzheimer's disease Neg Hx    Dementia Neg Hx      Prior to Admission  medications   Medication Sig Start Date End Date Taking? Authorizing Provider  acetaminophen (TYLENOL) 500 MG tablet Take 500 mg by mouth every 6 (six) hours as needed for mild pain or headache.   Yes [provider]  apixaban (ELIQUIS) 5 MG TABS tablet TAKE 1 TABLET BY MOUTH TWICE  DAILY Patient taking differently: Take 5 mg by mouth in the morning and at bedtime. 12/22/21  Yes Chilton Si, MD  atorvastatin (LIPITOR) 10 MG tablet Take 10 mg by mouth daily. 02/20/17  Yes [provider]  bismuth subsalicylate (PEPTO BISMOL) 262 MG/15ML suspension Take 15 mLs by mouth 2 (two) times daily as needed for diarrhea or loose stools.   Yes [provider]  Cholecalciferol (VITAMIN D3) 50 MCG (2000 UT) TABS Take 2,000 Units by mouth daily.   Yes [provider]  CRANBERRY EXTRACT PO Take 1 tablet by mouth daily.   Yes [provider]  denosumab (PROLIA) 60 MG/ML SOSY injection Inject 60 mg into the skin every 6 (six) months.   Yes [provider]  furosemide (LASIX) 20 MG tablet Take 1 tablet (20 mg total) by mouth daily. 04/24/17  Yes Newman Nip, NP  levothyroxine (SYNTHROID) 25 MCG tablet Take 25-50 mcg by mouth See admin instructions. Take 25 mcg by mouth in the morning before breakfast on Sunday and 50 mcg on Mon/Tues/Wed/Thurs/Fri/Sat   Yes [provider]  nebivolol (BYSTOLIC) 10 MG tablet Take 10 mg by mouth at bedtime.   Yes [provider]  Omega-3 Fatty Acids (FISH OIL) 1000 MG CAPS Take 1,000 mg by mouth daily.   Yes [provider]  potassium chloride (KLOR-CON 10) 10 MEQ tablet Take 10 mEq by mouth at bedtime.   Yes [provider]  diltiazem (CARDIZEM CD) 240 MG 24 hr capsule Take 1 capsule (240 mg total) by mouth daily. Patient not taking: Reported on 12/04/2021 02/24/21   Chilton Si, MD  escitalopram (LEXAPRO) 10 MG tablet Take 10 mg by mouth daily.    [provider]   HYDROcodone-acetaminophen (NORCO) 5-325 MG tablet Take 1 tablet by mouth every 6 (six) hours as needed. Patient not taking: Reported on 12/26/2021 12/04/21   Pricilla Loveless, MD  lidocaine (LIDODERM) 5 % Place 1 patch onto the skin daily. Remove & Discard patch within 12 hours or as directed by MD 12/04/21   Pricilla Loveless, MD  Multiple Vitamins-Minerals [facility] EYE HEALTH FORMULA) CAPS Take 1 capsule by mouth daily with breakfast.    [provider]    Physical Exam: Vitals:   12/26/21 1715 12/26/21 1730 12/26/21 1745 12/26/21 1800  BP: 99/62 116/72 (!) 102/52 106/66  Pulse: 100 74 63 61  Resp: (!) 21 (!) 22 (!) 23 (!) 24  Temp:      TempSrc:  SpO2: 95% 95% 93% 95%  Weight:      Height:        Constitutional: NAD, calm, comfortable Eyes: PERRL, lids and conjunctivae normal ENMT: Mucous membranes are moist. Posterior pharynx clear of any exudate or lesions.Normal dentition.  Neck: normal, supple, no masses, no thyromegaly Respiratory: clear to auscultation bilaterally, no wheezing, no crackles. Normal respiratory effort. No accessory muscle use.  Cardiovascular: irregularly irregular, no murmurs / rubs / gallops. 1+ extremity edema. 2+ pedal pulses. No carotid bruits.  Abdomen: no tenderness, no masses palpated. No hepatosplenomegaly. Bowel sounds positive.  Musculoskeletal: no clubbing / cyanosis. No joint deformity upper and lower extremities. Good ROM, no contractures. Normal muscle tone.  Skin: no rashes, lesions, ulcers. No induration Neurologic: CN 2-12 grossly intact. Sensation intact, DTR difficult to elicit in knees. Strength 3/5 in bilateral lower extremities, plantars are down going bilaterally  Psychiatric: Normal judgment and insight. Alert and oriented x 3. Normal mood.    Labs on Admission: I have personally reviewed following labs and imaging studies  CBC: Recent Labs  Lab 12/26/21 1400  WBC 10.3  NEUTROABS 8.6*  HGB 12.6  HCT 38.1  MCV 106.4*   PLT 156   Basic Metabolic Panel: Recent Labs  Lab 12/26/21 1400  NA 138  K 4.4  CL 106  CO2 22  GLUCOSE 126*  BUN 13  CREATININE 0.91  CALCIUM 8.6*   GFR: Estimated Creatinine Clearance: 40 mL/min (by C-G formula based on SCr of 0.91 mg/dL). Liver Function Tests: Recent Labs  Lab 12/26/21 1400  AST 38  ALT 26  ALKPHOS 34*  BILITOT 1.9*  PROT 6.5  ALBUMIN 3.2*   Recent Labs  Lab 12/26/21 1400  LIPASE 26   No results for input(s): "AMMONIA" in the last 168 hours. Coagulation Profile: Recent Labs  Lab 12/26/21 1400  INR 1.5*   Cardiac Enzymes: No results for input(s): "CKTOTAL", "CKMB", "CKMBINDEX", "TROPONINI" in the last 168 hours. BNP (last 3 results) No results for input(s): "PROBNP" in the last 8760 hours. HbA1C: No results for input(s): "HGBA1C" in the last 72 hours. CBG: No results for input(s): "GLUCAP" in the last 168 hours. Lipid Profile: No results for input(s): "CHOL", "HDL", "LDLCALC", "TRIG", "CHOLHDL", "LDLDIRECT" in the last 72 hours. Thyroid Function Tests: No results for input(s): "TSH", "T4TOTAL", "FREET4", "T3FREE", "THYROIDAB" in the last 72 hours. Anemia Panel: No results for input(s): "VITAMINB12", "FOLATE", "FERRITIN", "TIBC", "IRON", "RETICCTPCT" in the last 72 hours. Urine analysis:    Component Value Date/Time   COLORURINE YELLOW 12/26/2021 1557   APPEARANCEUR HAZY (A) 12/26/2021 1557   LABSPEC 1.008 12/26/2021 1557   PHURINE 7.0 12/26/2021 1557   GLUCOSEU NEGATIVE 12/26/2021 1557   HGBUR SMALL (A) 12/26/2021 1557   BILIRUBINUR NEGATIVE 12/26/2021 1557   KETONESUR NEGATIVE 12/26/2021 1557   PROTEINUR NEGATIVE 12/26/2021 1557   NITRITE POSITIVE (A) 12/26/2021 1557   LEUKOCYTESUR NEGATIVE 12/26/2021 1557    Radiological Exams on Admission: CT Lumbar Spine Wo Contrast  Result Date: 12/26/2021 CLINICAL DATA:  Low back pain, increased fracture risk EXAM: CT LUMBAR SPINE WITHOUT CONTRAST TECHNIQUE: Multidetector CT imaging  of the lumbar spine was performed without intravenous contrast administration. Multiplanar CT image reconstructions were also generated. RADIATION DOSE REDUCTION: This exam was performed according to the departmental dose-optimization program which includes automated exposure control, adjustment of the mA and/or kV according to patient size and/or use of iterative reconstruction technique. COMPARISON:  MRI lumbar spine 12/04/2021 FINDINGS: Segmentation: 5 lumbar type vertebrae. Alignment:  Slight levoconvex curvature. Grade 1 anterolisthesis at L4-L5 and L5-S1. Trace anterolisthesis at L3-L4. Vertebrae: There is no evidence of lumbar spine fracture. There is multilevel degenerative endplate sclerosis. Paraspinal and other soft tissues: Aortoiliac atherosclerosis. No other acute abnormality. Disc levels: There is severe multilevel degenerative disc disease and facet arthropathy, with varying degrees of spinal canal and neural foraminal stenosis, worst at L4-L5, best described on recent lumbar spine MRI 12/04/2021. IMPRESSION: No evidence of lumbar spine fracture. Advanced multilevel degenerative changes of the lumbar spine with varying degrees of spinal canal and neural foraminal stenosis, most severe at L4-L5, best described on recent lumbar spine MRI 12/04/2021. Electronically Signed   By: Caprice Renshaw M.D.   On: 12/26/2021 14:56   DG Chest Port 1 View  Result Date: 12/26/2021 CLINICAL DATA:  weakness atrial fibrillation and back pain EXAM: PORTABLE CHEST 1 VIEW COMPARISON:  December 16, 2021 FINDINGS: The heart size and mediastinal contours are within normal limits. There has been interval resolution of the opacity in the medial right upper lobe and near-complete resolution of the opacity at the left lung base. The visualized skeletal structures are unremarkable. IMPRESSION: There has been interval resolution of the opacity at the medial right upper lobe and near-complete resolution of the opacity at the left lung  base with likely minor residual opacity of atelectasis at the left lung base. Electronically Signed   By: Marjo Bicker M.D.   On: 12/26/2021 14:22    EKG: Independently reviewed. Rapid atrial fibrillation  Assessment/Plan Principal Problem:   Back pain Active Problems:   Persistent atrial fibrillation (HCC)   Essential hypertension   Fall at home, initial encounter   UTI (urinary tract infection)   Macular degeneration, bilateral   Hypothyroidism   Hyperlipidemia   Obesity (BMI 30-39.9)   Spinal stenosis     Back pain with leg weakness Severe spinal stenosis -Patient had MRI done earlier this month on 12/04/2021 that did show severe spinal stenosis in the lumbar and thoracic spine -Her images and case were reviewed with neurosurgery on-call today -It was not felt that she needed any urgent surgical evaluation at this time -He was also noted that she already has a neurosurgery referral in place, and family just needs to make appointment to see Dr. Wynetta Emery in the outpatient setting -He was also recommended to consider Medrol Dosepak at this time -Continue pain management and physical therapy  Possible urinary tract infection -Urine culture -Empirically treated with Rocephin  A-fib with RVR -She received some IV Cardizem -Overall heart rate is better -We will continue home dose of nebivolol -She is anticoagulated with apixaban  Hypertension -Blood pressures currently stable -Discontinue home dose of valsartan -Continue on nebivolol  Hypothyroidism -Continue Synthroid -Check TSH since she did present with tachycardia  Hyperlipidemia -Continue statin  Obesity - BMI 31.0  Macular degeneration, bilaterally -Severely compromised her eyesight  Fall Generalized weakness -PT/OT -Need skilled nursing facility placement  DVT prophylaxis: eliquis Code Status: DNR  Family Communication: discussed with daughter at the bedside  Disposition Plan: may need SNF  placement Consults called:   Admission status: telemetry obs   Erick Blinks MD [facility]   If 7PM-7AM, please contact night-coverage [email or web address]   12/26/2021, 6:38 PM

## 2021-12-26 NOTE — ED Triage Notes (Signed)
Patient was at her PCP office today for fa reevaluation and suggested that the patient come to the ED.  due to atrial fib, back pain, and not being safe at home.   Patient's daughter reports that she has to leave the patient during the day, but does stay with her at night.  Atrial fib HR-136.

## 2021-12-27 DIAGNOSIS — M810 Age-related osteoporosis without current pathological fracture: Secondary | ICD-10-CM | POA: Diagnosis present

## 2021-12-27 DIAGNOSIS — I10 Essential (primary) hypertension: Secondary | ICD-10-CM | POA: Diagnosis not present

## 2021-12-27 DIAGNOSIS — Y92009 Unspecified place in unspecified non-institutional (private) residence as the place of occurrence of the external cause: Secondary | ICD-10-CM

## 2021-12-27 DIAGNOSIS — I129 Hypertensive chronic kidney disease with stage 1 through stage 4 chronic kidney disease, or unspecified chronic kidney disease: Secondary | ICD-10-CM | POA: Diagnosis present

## 2021-12-27 DIAGNOSIS — Z881 Allergy status to other antibiotic agents status: Secondary | ICD-10-CM | POA: Diagnosis not present

## 2021-12-27 DIAGNOSIS — H3530 Unspecified macular degeneration: Secondary | ICD-10-CM | POA: Diagnosis present

## 2021-12-27 DIAGNOSIS — Z9841 Cataract extraction status, right eye: Secondary | ICD-10-CM | POA: Diagnosis not present

## 2021-12-27 DIAGNOSIS — N183 Chronic kidney disease, stage 3 unspecified: Secondary | ICD-10-CM | POA: Diagnosis present

## 2021-12-27 DIAGNOSIS — Z6831 Body mass index (BMI) 31.0-31.9, adult: Secondary | ICD-10-CM | POA: Diagnosis not present

## 2021-12-27 DIAGNOSIS — Z9049 Acquired absence of other specified parts of digestive tract: Secondary | ICD-10-CM | POA: Diagnosis not present

## 2021-12-27 DIAGNOSIS — Z7901 Long term (current) use of anticoagulants: Secondary | ICD-10-CM | POA: Diagnosis not present

## 2021-12-27 DIAGNOSIS — I4819 Other persistent atrial fibrillation: Secondary | ICD-10-CM | POA: Diagnosis present

## 2021-12-27 DIAGNOSIS — Z79890 Hormone replacement therapy (postmenopausal): Secondary | ICD-10-CM | POA: Diagnosis not present

## 2021-12-27 DIAGNOSIS — W19XXXA Unspecified fall, initial encounter: Secondary | ICD-10-CM | POA: Diagnosis present

## 2021-12-27 DIAGNOSIS — M549 Dorsalgia, unspecified: Secondary | ICD-10-CM | POA: Diagnosis present

## 2021-12-27 DIAGNOSIS — Z66 Do not resuscitate: Secondary | ICD-10-CM | POA: Diagnosis present

## 2021-12-27 DIAGNOSIS — I4891 Unspecified atrial fibrillation: Secondary | ICD-10-CM | POA: Diagnosis not present

## 2021-12-27 DIAGNOSIS — E1122 Type 2 diabetes mellitus with diabetic chronic kidney disease: Secondary | ICD-10-CM | POA: Diagnosis present

## 2021-12-27 DIAGNOSIS — Z9842 Cataract extraction status, left eye: Secondary | ICD-10-CM | POA: Diagnosis not present

## 2021-12-27 DIAGNOSIS — Z880 Allergy status to penicillin: Secondary | ICD-10-CM | POA: Diagnosis not present

## 2021-12-27 DIAGNOSIS — E7800 Pure hypercholesterolemia, unspecified: Secondary | ICD-10-CM | POA: Diagnosis present

## 2021-12-27 DIAGNOSIS — Z888 Allergy status to other drugs, medicaments and biological substances status: Secondary | ICD-10-CM | POA: Diagnosis not present

## 2021-12-27 DIAGNOSIS — Z8249 Family history of ischemic heart disease and other diseases of the circulatory system: Secondary | ICD-10-CM | POA: Diagnosis not present

## 2021-12-27 DIAGNOSIS — I959 Hypotension, unspecified: Secondary | ICD-10-CM | POA: Diagnosis present

## 2021-12-27 DIAGNOSIS — R000 Tachycardia, unspecified: Secondary | ICD-10-CM | POA: Diagnosis present

## 2021-12-27 DIAGNOSIS — E039 Hypothyroidism, unspecified: Secondary | ICD-10-CM | POA: Diagnosis present

## 2021-12-27 DIAGNOSIS — M48061 Spinal stenosis, lumbar region without neurogenic claudication: Secondary | ICD-10-CM | POA: Diagnosis present

## 2021-12-27 DIAGNOSIS — M545 Low back pain, unspecified: Secondary | ICD-10-CM | POA: Diagnosis not present

## 2021-12-27 DIAGNOSIS — E669 Obesity, unspecified: Secondary | ICD-10-CM | POA: Diagnosis present

## 2021-12-27 DIAGNOSIS — F0390 Unspecified dementia without behavioral disturbance: Secondary | ICD-10-CM | POA: Diagnosis present

## 2021-12-27 LAB — COMPREHENSIVE METABOLIC PANEL
ALT: 21 U/L (ref 0–44)
AST: 24 U/L (ref 15–41)
Albumin: 2.9 g/dL — ABNORMAL LOW (ref 3.5–5.0)
Alkaline Phosphatase: 39 U/L (ref 38–126)
Anion gap: 7 (ref 5–15)
BUN: 14 mg/dL (ref 8–23)
CO2: 29 mmol/L (ref 22–32)
Calcium: 8.5 mg/dL — ABNORMAL LOW (ref 8.9–10.3)
Chloride: 104 mmol/L (ref 98–111)
Creatinine, Ser: 1.03 mg/dL — ABNORMAL HIGH (ref 0.44–1.00)
GFR, Estimated: 53 mL/min — ABNORMAL LOW (ref >60)
Glucose, Bld: 143 mg/dL — ABNORMAL HIGH (ref 70–99)
Potassium: 4.6 mmol/L (ref 3.5–5.1)
Sodium: 140 mmol/L (ref 135–145)
Total Bilirubin: 1.3 mg/dL — ABNORMAL HIGH (ref 0.3–1.2)
Total Protein: 6.1 g/dL — ABNORMAL LOW (ref 6.5–8.1)

## 2021-12-27 LAB — URINE CULTURE: Culture: NO GROWTH

## 2021-12-27 LAB — TSH: TSH: 1.661 u[IU]/mL (ref 0.350–4.500)

## 2021-12-27 LAB — CBC
HCT: 35.9 % — ABNORMAL LOW (ref 36.0–46.0)
Hemoglobin: 12 g/dL (ref 12.0–15.0)
MCH: 35.6 pg — ABNORMAL HIGH (ref 26.0–34.0)
MCHC: 33.4 g/dL (ref 30.0–36.0)
MCV: 106.5 fL — ABNORMAL HIGH (ref 80.0–100.0)
Platelets: 271 10*3/uL (ref 150–400)
RBC: 3.37 MIL/uL — ABNORMAL LOW (ref 3.87–5.11)
RDW: 13.4 % (ref 11.5–15.5)
WBC: 8.4 10*3/uL (ref 4.0–10.5)
nRBC: 0 % (ref 0.0–0.2)

## 2021-12-27 MED ORDER — NYSTATIN 100000 UNIT/GM EX POWD
Freq: Two times a day (BID) | CUTANEOUS | Status: DC
Start: 2021-12-27 — End: 2021-12-30
  Filled 2021-12-27: qty 15

## 2021-12-27 MED ORDER — DILTIAZEM HCL 30 MG PO TABS
30.0000 mg | ORAL_TABLET | Freq: Two times a day (BID) | ORAL | Status: DC
  Administered 2021-12-27 – 2021-12-30 (×6): 30 mg via ORAL
  Filled 2021-12-27 (×6): qty 1

## 2021-12-27 NOTE — TOC Initial Note (Signed)
Transition of Care [facility]) - Initial/Assessment Note    Patient Details  Name: Kayla Zuniga MRN: [id number] Date of Birth: 07-01-1934  Transition [facility]) CM/SW Contact:    Otelia Santee, LCSW Phone Number: 12/27/2021, 1:34 PM  Clinical Narrative:                 Met with pt and daughter, Ethelle Lyon at bedside and confirmed plan for SNF placement. CSW discussed what the next steps in planning were. Pt and daughter have no current preference of SNF placement. CSW sent referrals for SNF placement and currently awaiting bed offers.   Expected Discharge Plan: Skilled Nursing Facility Barriers to Discharge: No Barriers Identified   Patient Goals and CMS Choice Patient states their goals for this hospitalization and ongoing recovery are:: To return home following SNF   Choice offered to / list presented to : Patient, Adult Children  Expected Discharge Plan and Services Expected Discharge Plan: Skilled Nursing Facility In-house Referral: Clinical Social Work Discharge Planning Services: CM Consult Post Acute Care Choice: Skilled Nursing Facility Living arrangements for the past 2 months: Single Family Home                 DME Arranged: N/A DME Agency: NA                  Prior Living Arrangements/Services Living arrangements for the past 2 months: Single Family Home Lives with:: Self Patient language and need for interpreter reviewed:: Yes Do you feel safe going back to the place where you live?: Yes      Need for Family Participation in Patient Care: Yes (Comment) Care giver support system in place?: No (comment) Current home services: DME Criminal Activity/Legal Involvement Pertinent to Current Situation/Hospitalization: No - Comment as needed  Activities of Daily Living Home Assistive Devices/Equipment: Environmental consultant (specify type), Cane (specify quad or straight), Other (Comment), Shower chair with back (rolator, raised toilet seat with seat insert) ADL  Screening (condition at time of admission) Patient's cognitive ability adequate to safely complete daily activities?: Yes Is the patient deaf or have difficulty hearing?: No Does the patient have difficulty seeing, even when wearing glasses/contacts?: Yes (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 - unsure which eye is blind) Does the patient have difficulty concentrating, remembering, or making decisions?: No Patient able to express need for assistance with ADLs?: Yes Does the patient have difficulty dressing or bathing?: Yes Independently performs ADLs?: No Communication: Independent Dressing (OT): Needs assistance Is this a change from baseline?: Pre-admission baseline Grooming: Needs assistance Is this a change from baseline?: Pre-admission baseline Feeding: Independent Bathing: Needs assistance Is this a change from baseline?: Pre-admission baseline Toileting: Needs assistance Is this a change from baseline?: Pre-admission baseline In/Out Bed: Needs assistance Is this a change from baseline?: Pre-admission baseline Walks in Home: Needs assistance Is this a change from baseline?: Pre-admission baseline Does the patient have difficulty walking or climbing stairs?: Yes Weakness of Legs: Both Weakness of Arms/Hands: Both  Permission Sought/Granted Permission sought to share information with : Facility Medical sales representative, Family Supports Permission granted to share information with : Yes, Verbal Permission Granted  Share Information with NAME: Ethelle Lyon     Permission granted to share info w Relationship: Daughter  Permission granted to share info w Contact Information: [phone]  Emotional Assessment Appearance:: Appears stated age Attitude/Demeanor/Rapport: Lethargic Affect (typically observed): Flat Orientation: : Oriented to Self, Oriented to Place, Oriented to  Time, Oriented  to Situation Alcohol / Substance Use: Not Applicable Psych Involvement: No  (comment)  Admission diagnosis:  Back pain [M54.9] Generalized weakness [R53.1] Atrial fibrillation with RVR (HCC) [I48.91] Frequent falls [R29.6] Acute right-sided low back pain with right-sided sciatica [M54.41] Patient Active Problem List   Diagnosis Date Noted   Back pain 12/26/2021   Fall at home, initial encounter 12/26/2021   UTI (urinary tract infection) 12/26/2021   Macular degeneration, bilateral 12/26/2021   Hypothyroidism 12/26/2021   Hyperlipidemia 12/26/2021   Obesity (BMI 30-39.9) 12/26/2021   Spinal stenosis 12/26/2021   Psychosis (HCC) 09/14/2021   Anticoagulated 11/04/2018   Non-insulin dependent type 2 diabetes mellitus (HCC) 11/04/2018   CRI (chronic renal insufficiency), stage 3 (moderate) (HCC) 11/04/2018   Essential hypertension 09/19/2017   Persistent atrial fibrillation (HCC)    PCP:  Laurann Montana, MD Pharmacy:   [facility] [phone] [place], [place] - [address] AT [facility] [address] [place] [address] Phone: [phone] Fax: [phone]     Social Determinants of Health (SDOH) Interventions    Readmission Risk Interventions     No data to display

## 2021-12-28 DIAGNOSIS — M4800 Spinal stenosis, site unspecified: Secondary | ICD-10-CM

## 2021-12-28 DIAGNOSIS — H3530 Unspecified macular degeneration: Secondary | ICD-10-CM

## 2021-12-28 DIAGNOSIS — I4819 Other persistent atrial fibrillation: Secondary | ICD-10-CM

## 2021-12-28 DIAGNOSIS — E669 Obesity, unspecified: Secondary | ICD-10-CM

## 2021-12-28 DIAGNOSIS — E039 Hypothyroidism, unspecified: Secondary | ICD-10-CM

## 2021-12-28 LAB — COMPREHENSIVE METABOLIC PANEL
ALT: 27 U/L (ref 0–44)
AST: 26 U/L (ref 15–41)
Albumin: 2.9 g/dL — ABNORMAL LOW (ref 3.5–5.0)
Alkaline Phosphatase: 36 U/L — ABNORMAL LOW (ref 38–126)
Anion gap: 9 (ref 5–15)
BUN: 22 mg/dL (ref 8–23)
CO2: 26 mmol/L (ref 22–32)
Calcium: 8.6 mg/dL — ABNORMAL LOW (ref 8.9–10.3)
Chloride: 104 mmol/L (ref 98–111)
Creatinine, Ser: 0.88 mg/dL (ref 0.44–1.00)
GFR, Estimated: >60 mL/min (ref >60)
Glucose, Bld: 172 mg/dL — ABNORMAL HIGH (ref 70–99)
Potassium: 4.2 mmol/L (ref 3.5–5.1)
Sodium: 139 mmol/L (ref 135–145)
Total Bilirubin: 0.9 mg/dL (ref 0.3–1.2)
Total Protein: 6.1 g/dL — ABNORMAL LOW (ref 6.5–8.1)

## 2021-12-28 LAB — CBC
HCT: 36.5 % (ref 36.0–46.0)
Hemoglobin: 12.3 g/dL (ref 12.0–15.0)
MCH: 35.8 pg — ABNORMAL HIGH (ref 26.0–34.0)
MCHC: 33.7 g/dL (ref 30.0–36.0)
MCV: 106.1 fL — ABNORMAL HIGH (ref 80.0–100.0)
Platelets: 288 10*3/uL (ref 150–400)
RBC: 3.44 MIL/uL — ABNORMAL LOW (ref 3.87–5.11)
RDW: 13.3 % (ref 11.5–15.5)
WBC: 7.4 10*3/uL (ref 4.0–10.5)
nRBC: 0 % (ref 0.0–0.2)

## 2021-12-28 MED ORDER — CHLORHEXIDINE GLUCONATE CLOTH 2 % EX PADS
6.0000 | MEDICATED_PAD | Freq: Every day | CUTANEOUS | Status: DC
  Administered 2021-12-28 – 2021-12-30 (×3): 6 via TOPICAL

## 2021-12-28 NOTE — Plan of Care (Signed)

## 2021-12-28 NOTE — TOC Progression Note (Signed)
Transition of Care [facility]) - Progression Note    Patient Details  Name: Kayla Zuniga MRN: [id number] Date of Birth: 1933-09-11  [facility] CM/SW Contact  Otelia Santee, LCSW Phone Number: 12/28/2021, 10:30 AM  Clinical Narrative:    Spoke with pt's daughter, Kayla Zuniga ([phone]) to review bed offers for SNF placement. [name] plans to stop by each facility today prior to making decision on bed offer. CSW will touch base with daughter later this afternoon to determine placement for this pt.    Expected Discharge Plan: Skilled Nursing Facility Barriers to Discharge: No Barriers Identified  Expected Discharge Plan and Services Expected Discharge Plan: Skilled Nursing Facility In-house Referral: Clinical Social Work Discharge Planning Services: CM Consult Post Acute Care Choice: Skilled Nursing Facility Living arrangements for the past 2 months: Single Family Home                 DME Arranged: N/A DME Agency: NA                   Social Determinants of Health (SDOH) Interventions    Readmission Risk Interventions     No data to display

## 2021-12-28 NOTE — Progress Notes (Addendum)
PROGRESS NOTE    Kayla Zuniga  ZOX:[id number] DOB: 1934/03/09 DOA: 12/26/2021 PCP: Laurann Montana, MD   Brief Narrative:  88 y.o. female with medical history significant of atrial fibrillation on anticoagulation, hypertension, macular degeneration, hypothyroidism presented with worsening lower extremity weakness for 1 to 2 months along with lower back pain, bilateral lower extremity numbness and intermittent bowel and bladder incontinence.  She can no longer ambulate.  She was found to have severe spinal stenosis as an outpatient and was supposed to have neurosurgery evaluation as an outpatient as well.  During the hospitalization, PT recommended SNF placement.  Assessment & Plan:   Back pain with leg weakness Severe spinal stenosis --Patient had MRI done earlier this month on 12/04/2021 that did show severe spinal stenosis in the lumbar and thoracic spine -Case was reviewed with neurosurgery on-call by prior hospitalist who recommended no urgent neurosurgical evaluation needs and recommended that patient be evaluated by Dr. Wynetta Emery in the outpatient setting; she already has a neurosurgery referral in place -Currently on Medrol Dosepak -Continue pain management and physical therapy.  Currently medically stable for SNF placement.  Urinary tract infection has been ruled out -Antibiotics have been discontinued  Persistent atrial fibrillation  -Currently rate from.  Continue Eliquis, Cardizem and nebivolol.  Outpatient follow-up with cardiology  Hypertension -Blood pressure on the lower side.  Continue Cardizem and nebivolol.  Valsartan held  Hyperlipidemia Continue statin  Hypothyroidism--continue Synthroid  Obesity Outpatient follow-up  Macular degeneration, bilateral -Severely compromised eyesight.  Outpatient follow-up with ophthalmology  Fall Generalized weakness -Will need PT/OT at SNF.  Social worker following.  DVT prophylaxis: Eliquis Code Status: DNR Family  Communication: None at bedside Disposition Plan: Status is: Inpatient Remains inpatient appropriate because: Of need for SNF placement  Consultants: None  Procedures: None  Antimicrobials: None   Subjective: Patient seen and examined at bedside.  Feels weak.  No overnight fever, vomiting, chest pain reported.  Poor historian.  Objective: Vitals:   12/27/21 1420 12/27/21 2139 12/28/21 0602 12/28/21 0900  BP: 112/65 130/83 122/68 (!) 116/58  Pulse: 91 84 97 70  Resp: 16 17 20    Temp: (!) 97.5 F (36.4 C) 97.6 F (36.4 C) 98 F (36.7 C)   TempSrc: Oral  Oral   SpO2: 92% 96% 95%   Weight:      Height:        Intake/Output Summary (Last 24 hours) at 12/28/2021 1154 Last data filed at 12/28/2021 0606 Gross per 24 hour  Intake 580 ml  Output 1310 ml  Net -730 ml   Filed Weights   12/26/21 1330  Weight: 72.1 kg    Examination:  General exam: Appears calm and comfortable.  Elderly female lying in bed.  Currently on room air.  Looks chronically ill and deconditioned. Respiratory system: Bilateral decreased breath sounds at bases Cardiovascular system: S1 & S2 heard, Rate controlled Gastrointestinal system: Abdomen is nondistended, soft and nontender. Normal bowel sounds heard. Extremities: No cyanosis, clubbing; trace lower extremity edema Central nervous system: Awake, slow to respond, poor historian.  No focal neurological deficits. Moving extremities Skin: No rashes, lesions or ulcers Psychiatry: Flat affect.  No signs of agitation.    Data Reviewed: I have personally reviewed following labs and imaging studies  CBC: Recent Labs  Lab 12/26/21 1400 12/27/21 0530 12/28/21 0546  WBC 10.3 8.4 7.4  NEUTROABS 8.6*  --   --   HGB 12.6 12.0 12.3  HCT 38.1 35.9* 36.5  MCV 106.4* 106.5* 106.1*  PLT 156 271 [id number]   Basic Metabolic Panel: Recent Labs  Lab 12/26/21 1400 12/27/21 0530 12/28/21 0546  NA 138 140 139  K 4.4 4.6 4.2  CL 106 104 104  CO2 22 29 26    GLUCOSE 126* 143* 172*  BUN 13 14 22   CREATININE 0.91 1.03* 0.88  CALCIUM 8.6* 8.5* 8.6*   GFR: Estimated Creatinine Clearance: 41.4 mL/min (by C-G formula based on SCr of 0.88 mg/dL). Liver Function Tests: Recent Labs  Lab 12/26/21 1400 12/27/21 0530 12/28/21 0546  AST 38 24 26  ALT 26 21 27   ALKPHOS 34* 39 36*  BILITOT 1.9* 1.3* 0.9  PROT 6.5 6.1* 6.1*  ALBUMIN 3.2* 2.9* 2.9*   Recent Labs  Lab 12/26/21 1400  LIPASE 26   No results for input(s): "AMMONIA" in the last 168 hours. Coagulation Profile: Recent Labs  Lab 12/26/21 1400  INR 1.5*   Cardiac Enzymes: No results for input(s): "CKTOTAL", "CKMB", "CKMBINDEX", "TROPONINI" in the last 168 hours. BNP (last 3 results) No results for input(s): "PROBNP" in the last 8760 hours. HbA1C: No results for input(s): "HGBA1C" in the last 72 hours. CBG: No results for input(s): "GLUCAP" in the last 168 hours. Lipid Profile: No results for input(s): "CHOL", "HDL", "LDLCALC", "TRIG", "CHOLHDL", "LDLDIRECT" in the last 72 hours. Thyroid Function Tests: Recent Labs    12/27/21 0530  TSH 1.661   Anemia Panel: No results for input(s): "VITAMINB12", "FOLATE", "FERRITIN", "TIBC", "IRON", "RETICCTPCT" in the last 72 hours. Sepsis Labs: Recent Labs  Lab 12/26/21 1355 12/26/21 1555  LATICACIDVEN 1.7 1.5    Recent Results (from the past 240 hour(s))  Urine Culture     Status: None   Collection Time: 12/26/21  3:57 PM   Specimen: Urine, Clean Catch  Result Value Ref Range Status   Specimen Description   Final    URINE, CLEAN CATCH Performed at [facility], [place] [address], [place], [place] [address]    Special Requests   Final    NONE Performed at [facility], [place] [address] [place], [place] [address]    Culture   Final    NO GROWTH Performed at [facility], [place] [address], [place], [place] [address]    Report Status 12/27/2021 FINAL  Final         Radiology  Studies: CT Lumbar Spine Wo Contrast  Result Date: 12/26/2021 CLINICAL DATA:  Low back pain, increased fracture risk EXAM: CT LUMBAR SPINE WITHOUT CONTRAST TECHNIQUE: Multidetector CT imaging of the lumbar spine was performed without intravenous contrast administration. Multiplanar CT image reconstructions were also generated. RADIATION DOSE REDUCTION: This exam was performed according to the departmental dose-optimization program which includes automated exposure control, adjustment of the mA and/or kV according to patient size and/or use of iterative reconstruction technique. COMPARISON:  MRI lumbar spine 12/04/2021 FINDINGS: Segmentation: 5 lumbar type vertebrae. Alignment: Slight levoconvex curvature. Grade 1 anterolisthesis at L4-L5 and L5-S1. Trace anterolisthesis at L3-L4. Vertebrae: There is no evidence of lumbar spine fracture. There is multilevel degenerative endplate sclerosis. Paraspinal and other soft tissues: Aortoiliac atherosclerosis. No other acute abnormality. Disc levels: There is severe multilevel degenerative disc disease and facet arthropathy, with varying degrees of spinal canal and neural foraminal stenosis, worst at L4-L5, best described on recent lumbar spine MRI 12/04/2021. IMPRESSION: No evidence of lumbar spine fracture. Advanced multilevel degenerative changes of the lumbar spine with varying degrees of spinal canal and neural foraminal stenosis, most severe at L4-L5, best  described on recent lumbar spine MRI 12/04/2021. Electronically Signed   By: Caprice Renshaw M.D.   On: 12/26/2021 14:56   DG Chest Port 1 View  Result Date: 12/26/2021 CLINICAL DATA:  weakness atrial fibrillation and back pain EXAM: PORTABLE CHEST 1 VIEW COMPARISON:  December 16, 2021 FINDINGS: The heart size and mediastinal contours are within normal limits. There has been interval resolution of the opacity in the medial right upper lobe and near-complete resolution of the opacity at the left lung base. The  visualized skeletal structures are unremarkable. IMPRESSION: There has been interval resolution of the opacity at the medial right upper lobe and near-complete resolution of the opacity at the left lung base with likely minor residual opacity of atelectasis at the left lung base. Electronically Signed   By: Marjo Bicker M.D.   On: 12/26/2021 14:22        Scheduled Meds:  apixaban  5 mg Oral BID   atorvastatin  10 mg Oral Daily   Chlorhexidine Gluconate Cloth  6 each Topical Daily   diltiazem  30 mg Oral BID   escitalopram  10 mg Oral Daily   [START ON 01/01/2022] levothyroxine  25 mcg Oral Q Sun   levothyroxine  50 mcg Oral Once per day on Mon Tue Wed Thu Fri Sat   methylPREDNISolone  4 mg Oral 4X daily taper   nebivolol  10 mg Oral QHS   nystatin   Topical BID   Continuous Infusions:        Glade Lloyd, MD [facility] 12/28/2021, 11:54 AM

## 2021-12-29 MED ORDER — SENNOSIDES-DOCUSATE SODIUM 8.6-50 MG PO TABS
1.0000 | ORAL_TABLET | Freq: Two times a day (BID) | ORAL | Status: DC
  Administered 2021-12-29 – 2021-12-30 (×3): 1 via ORAL
  Filled 2021-12-29 (×3): qty 1

## 2021-12-29 MED ORDER — SENNOSIDES-DOCUSATE SODIUM 8.6-50 MG PO TABS: 1.0000 | ORAL_TABLET | Freq: Two times a day (BID) | ORAL | 0 refills | Status: DC

## 2021-12-29 MED ORDER — BISACODYL 10 MG RE SUPP: 10.0000 mg | Freq: Every day | RECTAL | Status: DC | PRN

## 2021-12-29 MED ORDER — POLYETHYLENE GLYCOL 3350 17 G PO PACK: 17.0000 g | PACK | Freq: Every day | ORAL | Status: DC | PRN

## 2021-12-29 MED ORDER — METHYLPREDNISOLONE 4 MG PO TBPK: ORAL_TABLET | ORAL | Status: DC

## 2021-12-29 MED ORDER — POLYETHYLENE GLYCOL 3350 17 G PO PACK: 17.0000 g | PACK | Freq: Every day | ORAL | 0 refills | Status: DC | PRN

## 2021-12-29 NOTE — Discharge Summary (Signed)
Physician Discharge Summary  Kayla Zuniga [id number] DOB: August 23, 1933 DOA: 12/26/2021  PCP: Harlan Stains, MD  Admit date: 12/26/2021 Discharge date: 12/29/2021  Admitted From: Home Disposition: SNF  Recommendations for Outpatient Follow-up:  Follow up with SNF provider at earliest convenience Follow-up with neurosurgery as an outpatient Follow up in ED if symptoms worsen or new appear   Home Health: No Equipment/Devices: None  Discharge Condition: Stable CODE STATUS: Full Diet recommendation: Heart healthy  Brief/Interim Summary:   Discharge Diagnoses:   Back pain with leg weakness Severe spinal stenosis --Patient had MRI done earlier this month on 12/04/2021 that did show severe spinal stenosis in the lumbar and thoracic spine -Case was reviewed with neurosurgery on-call by prior hospitalist who recommended no urgent neurosurgical evaluation needs and recommended that patient be evaluated by Dr. Saintclair Halsted in the outpatient setting; she already has a neurosurgery referral in place -Currently on Medrol Dosepak which will be continued and tapered off. -Continue pain management and physical therapy.  Currently medically stable for SNF placement.   Urinary tract infection has been ruled out -Antibiotics have been discontinued   Possible delirium Possible undiagnosed dementia -As per the daughter, patient is slightly more confused this morning and slow to respond: Might be possibly from delirium.  Patient possibly also has undiagnosed dementia. -Monitor mental status.  Will need outpatient neurology evaluation.   Persistent atrial fibrillation  -Currently rate controlled. Continue Eliquis and nebivolol.  Outpatient follow-up with cardiology   Hypertension -Blood pressure on the lower side.  Continue nebivolol. Lasix held  Hyperlipidemia -Continue statin  Hypothyroidism--continue Synthroid  Obesity -Outpatient follow-up  Macular degeneration, bilateral -Severely  compromised eyesight.  Outpatient follow-up with ophthalmology   Fall Generalized weakness -Will need PT/OT at SNF.     Discharge Instructions  Discharge Instructions     Diet - low sodium heart healthy   Complete by: As directed    Increase activity slowly   Complete by: As directed       Allergies as of 12/29/2021       Reactions   Penicillins Hives, Itching, Rash      Sulfa Antibiotics Itching, Rash   Atenolol Other (See Comments)   Dizziness- made the patient "tired"   Ibandronic Acid Other (See Comments)   Hallucinations   Alendronate Other (See Comments)   Muscle/bone pain   Amlodipine Besylate Swelling, Other (See Comments)   Edema at higher doses   Prolia [denosumab] Other (See Comments)   "Hallucinations," but the patient received this in 2023        Medication List     STOP taking these medications    diltiazem 240 MG 24 hr capsule Commonly known as: CARDIZEM CD   furosemide 20 MG tablet Commonly known as: Lasix   HYDROcodone-acetaminophen 5-325 MG tablet Commonly known as: Norco   Klor-Con 10 10 MEQ tablet Generic drug: potassium chloride       TAKE these medications    acetaminophen 500 MG tablet Commonly known as: TYLENOL Take 500 mg by mouth every 6 (six) hours as needed for mild pain or headache.   atorvastatin 10 MG tablet Commonly known as: LIPITOR Take 10 mg by mouth daily.   bismuth subsalicylate [id number] suspension Commonly known as: PEPTO BISMOL Take 15 mLs by mouth 2 (two) times daily as needed for diarrhea or loose stools.   CRANBERRY EXTRACT PO Take 1 tablet by mouth daily.   denosumab 60 MG/ML Sosy injection Commonly known as: PROLIA Inject 60 mg into  the skin every 6 (six) months.   Eliquis 5 MG Tabs tablet Generic drug: apixaban TAKE 1 TABLET BY MOUTH TWICE  DAILY What changed:  how much to take when to take this   escitalopram 10 MG tablet Commonly known as: LEXAPRO Take 10 mg by mouth daily.    Fish Oil 1000 MG Caps Take 1,000 mg by mouth daily.   levothyroxine 25 MCG tablet Commonly known as: SYNTHROID Take 25-50 mcg by mouth See admin instructions. Take 25 mcg by mouth in the morning before breakfast on Sunday and 50 mcg on Mon/Tues/Wed/Thurs/Fri/Sat   lidocaine 5 % Commonly known as: Lidoderm Place 1 patch onto the skin daily. Remove & Discard patch within 12 hours or as directed by MD   methylPREDNISolone 4 MG Tbpk tablet Commonly known as: MEDROL DOSEPAK Take 1 tablet (4 mg total) by mouth every evening for 1 day, THEN 1 tablet (4 mg total) 2 (two) times daily for 1 day, THEN 1 tablet (4 mg total) every morning for 1 day. Patient should finish remainder of Medrol Dosepak that was started on 6/28. Take 1 tablet this evening on 6/29, then take 1 tablet two times daily on 6/30, then 1 tablet in the morning on 7/1 to complete the steroid taper   nebivolol 10 MG tablet Commonly known as: BYSTOLIC Take 10 mg by mouth at bedtime.   Ocuvite Eye Health Formula Caps Take 1 capsule by mouth daily with breakfast.   polyethylene glycol 17 g packet Commonly known as: MIRALAX / GLYCOLAX Take 17 g by mouth daily as needed for moderate constipation.   senna-docusate 8.6-50 MG tablet Commonly known as: Senokot-S Take 1 tablet by mouth 2 (two) times daily.   Vitamin D3 50 MCG (2000 UT) Tabs Take 2,000 Units by mouth daily.        Contact information for after-discharge care     Destination     [facility] .   Service: Skilled Nursing Contact information: [place] [address] [phone]                    Allergies  Allergen Reactions   Penicillins Hives, Itching and Rash        Sulfa Antibiotics Itching and Rash   Atenolol Other (See Comments)    Dizziness- made the patient "tired"   Ibandronic Acid Other (See Comments)    Hallucinations   Alendronate Other (See Comments)    Muscle/bone pain    Amlodipine Besylate Swelling and Other (See Comments)    Edema at higher doses   Prolia [Denosumab] Other (See Comments)    "Hallucinations," but the patient received this in 2023    Consultations: None   Procedures/Studies: CT Lumbar Spine Wo Contrast  Result Date: 12/26/2021 CLINICAL DATA:  Low back pain, increased fracture risk EXAM: CT LUMBAR SPINE WITHOUT CONTRAST TECHNIQUE: Multidetector CT imaging of the lumbar spine was performed without intravenous contrast administration. Multiplanar CT image reconstructions were also generated. RADIATION DOSE REDUCTION: This exam was performed according to the departmental dose-optimization program which includes automated exposure control, adjustment of the mA and/or kV according to patient size and/or use of iterative reconstruction technique. COMPARISON:  MRI lumbar spine 12/04/2021 FINDINGS: Segmentation: 5 lumbar type vertebrae. Alignment: Slight levoconvex curvature. Grade 1 anterolisthesis at L4-L5 and L5-S1. Trace anterolisthesis at L3-L4. Vertebrae: There is no evidence of lumbar spine fracture. There is multilevel degenerative endplate sclerosis. Paraspinal and other soft tissues: Aortoiliac atherosclerosis.  No other acute abnormality. Disc levels: There is severe multilevel degenerative disc disease and facet arthropathy, with varying degrees of spinal canal and neural foraminal stenosis, worst at L4-L5, best described on recent lumbar spine MRI 12/04/2021. IMPRESSION: No evidence of lumbar spine fracture. Advanced multilevel degenerative changes of the lumbar spine with varying degrees of spinal canal and neural foraminal stenosis, most severe at L4-L5, best described on recent lumbar spine MRI 12/04/2021. Electronically Signed   By: Caprice Renshaw M.D.   On: 12/26/2021 14:56   DG Chest Port 1 View  Result Date: 12/26/2021 CLINICAL DATA:  weakness atrial fibrillation and back pain EXAM: PORTABLE CHEST 1 VIEW COMPARISON:  December 16, 2021  FINDINGS: The heart size and mediastinal contours are within normal limits. There has been interval resolution of the opacity in the medial right upper lobe and near-complete resolution of the opacity at the left lung base. The visualized skeletal structures are unremarkable. IMPRESSION: There has been interval resolution of the opacity at the medial right upper lobe and near-complete resolution of the opacity at the left lung base with likely minor residual opacity of atelectasis at the left lung base. Electronically Signed   By: Marjo Bicker M.D.   On: 12/26/2021 14:22   CT Cervical Spine Wo Contrast  Result Date: 12/16/2021 CLINICAL DATA:  Neck trauma (Age >= 65y) EXAM: CT CERVICAL SPINE WITHOUT CONTRAST TECHNIQUE: Multidetector CT imaging of the cervical spine was performed without intravenous contrast. Multiplanar CT image reconstructions were also generated. RADIATION DOSE REDUCTION: This exam was performed according to the departmental dose-optimization program which includes automated exposure control, adjustment of the mA and/or kV according to patient size and/or use of iterative reconstruction technique. COMPARISON:  None Available. FINDINGS: Alignment: No significant listhesis. Skull base and vertebrae: No acute fracture. Degenerative endplate irregularity. Soft tissues and spinal canal: No prevertebral fluid or swelling. No visible canal hematoma. Disc levels: Multilevel degenerative changes are present including disc space narrowing, endplate osteophytes, and facet and uncovertebral hypertrophy. Upper chest: Included lung apices are clear. Other: Calcified plaque at the common carotid bifurcations. IMPRESSION: No acute cervical spine fracture. Electronically Signed   By: Guadlupe Spanish M.D.   On: 12/16/2021 09:11   CT Head Wo Contrast  Result Date: 12/16/2021 CLINICAL DATA:  Head trauma, minor (Age >= 65y) EXAM: CT HEAD WITHOUT CONTRAST TECHNIQUE: Contiguous axial images were obtained from the  base of the skull through the vertex without intravenous contrast. RADIATION DOSE REDUCTION: This exam was performed according to the departmental dose-optimization program which includes automated exposure control, adjustment of the mA and/or kV according to patient size and/or use of iterative reconstruction technique. COMPARISON:  None Available. FINDINGS: Brain: There is no acute intracranial hemorrhage, mass effect, or edema. Gray-white differentiation is preserved. There is no extra-axial fluid collection. Prominence of the ventricles and sulci reflects parenchymal volume loss. Patchy hypoattenuation in the supratentorial white matter is nonspecific but may reflect mild chronic microvascular ischemic changes. Vascular: There is atherosclerotic calcification at the skull base. Skull: Calvarium is unremarkable. Sinuses/Orbits: No acute finding. Other: None. IMPRESSION: No evidence of acute intracranial injury. Chronic/nonemergent findings detailed above. Electronically Signed   By: Guadlupe Spanish M.D.   On: 12/16/2021 09:01   DG Pelvis Portable  Result Date: 12/16/2021 CLINICAL DATA:  Fall.  Head injury.  Altered mental status. EXAM: PORTABLE PELVIS 1-2 VIEWS COMPARISON:  CT abdomen and pelvis 12/04/2021. FINDINGS: 0831 hours. The bones are diffusely demineralized. No evidence of acute fracture, dislocation or bone  destruction. Stable mild degenerative changes of the hips and sacroiliac joints. The soft tissues appear unremarkable. IMPRESSION: No evidence of acute pelvic fracture or dislocation. Electronically Signed   By: Carey Bullocks M.D.   On: 12/16/2021 08:50   DG Chest Port 1 View  Result Date: 12/16/2021 CLINICAL DATA:  Provided history: Fall, head injury, altered mental status. EXAM: PORTABLE CHEST 1 VIEW COMPARISON:  Prior chest radiographs 09/13/2021. FINDINGS: Heart size at the upper limits of normal (when accounting for AP technique). Ill-defined opacity within the medial right upper lobe.  Additional opacity within the left lung base. Small left pleural effusion. No evidence of pneumothorax. No acute bony abnormality identified. Degenerative changes of the spine. Levocurvature of the lower thoracic and upper lumbar spine. IMPRESSION: Opacities within the medial right upper lobe and left lung base, which may reflect atelectasis or aspiration/pneumonia. Small left pleural effusion. Electronically Signed   By: Jackey Loge D.O.   On: 12/16/2021 08:48   MR LUMBAR SPINE WO CONTRAST  Result Date: 12/04/2021 CLINICAL DATA:  Low back pain, cauda equina syndrome suspected EXAM: MRI LUMBAR SPINE WITHOUT CONTRAST TECHNIQUE: Multiplanar, multisequence MR imaging of the lumbar spine was performed. No intravenous contrast was administered. COMPARISON:  CT 12/04/2021 FINDINGS: Segmentation:  Standard. Alignment: Mild lumbar levocurvature. Grade 1 anterolisthesis of L4 on L5 and L5 on S1. Vertebrae: No fracture, evidence of discitis, or bone lesion. Multilevel discogenic endplate marrow changes. Conus medullaris and cauda equina: Conus extends to the L1-2 level. Slight bunching of the cauda equina nerve roots above the levels of maximal stenosis. Subtly increased T2/STIR signal within the lower thoracic cord at T11-T12. Cord is minimally diminutive in caliber at this location. Paraspinal and other soft tissues: Negative. Disc levels: T12-L1: Disc height loss with mild disc bulge, slightly eccentric to the right. Mild bilateral facet arthropathy. Mild canal stenosis with mild right foraminal stenosis. L1-L2: Disc height loss with endplate ridging and mild disc bulge. Moderate right and mild left facet arthropathy with ligamentum flavum buckling. Moderate canal stenosis with severe right and mild left foraminal stenosis. L2-L3: Disc height loss with mild shallow central disc protrusion and endplate ridging. Moderate bilateral facet arthropathy with ligamentum flavum buckling. Moderate canal stenosis with moderate to  severe right and mild left foraminal stenosis. L3-L4: Disc height loss with endplate ridging and mild disc bulge. Moderate-severe bilateral facet arthropathy with ligamentum flavum buckling. Moderate canal stenosis with severe right and moderate-severe left foraminal stenosis. L4-L5: Mild disc uncovering with diffuse disc bulge and endplate spurring. Moderate-severe bilateral facet arthropathy with ligamentum flavum buckling. Findings result in severe canal stenosis with moderate-severe left and mild right foraminal stenosis. L5-S1: Anterolisthesis with disc uncovering and mild diffuse disc bulge. Moderate-severe bilateral facet arthropathy. Moderate bilateral foraminal stenosis. No significant canal stenosis. IMPRESSION: 1. Advanced multilevel degenerative changes of the lumbar spine. Findings are most pronounced at the L4-5 level where there is severe canal stenosis and moderate-severe left foraminal stenosis. 2. Moderate canal stenosis at the L1-2, L2-3, and L3-4 levels. 3. Severe right foraminal stenosis at L1-2 and L2-3. 4. Stenosis within the lower thoracic spine, as described on dedicated thoracic spine MR. Electronically Signed   By: Duanne Guess D.O.   On: 12/04/2021 16:58   MR THORACIC SPINE WO CONTRAST  Result Date: 12/04/2021 CLINICAL DATA:  Low back pain EXAM: MRI THORACIC SPINE WITHOUT CONTRAST TECHNIQUE: Multiplanar, multisequence MR imaging of the thoracic spine was performed. No intravenous contrast was administered. COMPARISON:  None Available. FINDINGS: Alignment: Mildly exaggerated  thoracic kyphosis. No static listhesis. Vertebrae: No fracture, evidence of discitis, or bone lesion. Multilevel discogenic endplate marrow changes. Scattered small endplate Schmorl's nodes. Cord: Cord is focally diminutive with subtly increased T2/STIR signal at the T11-12 level (series 17, image 12). Thoracic cord appears otherwise within normal limits. There is canal stenosis with impress upon the cervical  cord at the C4-5, C5-6, and C6-7 levels seen at the edge of the field of view (series 17, image 10). Paraspinal and other soft tissues: Small layering left pleural effusion. Degenerative arthropathy of the bilateral sternoclavicular joints. Disc levels: Multilevel intervertebral disc height loss with multiple small disc bulges and endplate spurring throughout the thoracic spine. Findings most notably involve the T11-12 level where disc bulge and posterior element hypertrophy contribute to at least moderate canal stenosis. Mild canal stenosis at T10-11. No additional sites of canal stenosis within the thoracic spine. Left-sided foraminal stenosis at T9-10 through T11-T12. IMPRESSION: 1. Multilevel degenerative changes of the thoracic spine, most pronounced at the T11-12 level where there is at least moderate canal stenosis and left-sided foraminal stenosis. 2. Focally diminutive appearance of the thoracic spinal cord at the T11-12 level with subtly increased cord signal suggesting chronic compressive myelomalacia. 3. Partially visualized cervical spondylosis with canal stenosis at the C4-5, C5-6, and C6-7 levels seen at the edge of the field of view. This could be further characterized with dedicated cervical spine MRI, as indicated by patient's symptoms. 4. Small layering left pleural effusion. Electronically Signed   By: Davina Poke D.O.   On: 12/04/2021 16:39   CT Renal Stone Study  Result Date: 12/04/2021 CLINICAL DATA:  Back pain.  Kidney stone suspected. EXAM: CT ABDOMEN AND PELVIS WITHOUT CONTRAST TECHNIQUE: Multidetector CT imaging of the abdomen and pelvis was performed following the standard protocol without IV contrast. RADIATION DOSE REDUCTION: This exam was performed according to the departmental dose-optimization program which includes automated exposure control, adjustment of the mA and/or kV according to patient size and/or use of iterative reconstruction technique. COMPARISON:  None  Available. FINDINGS: Lower chest: Right lower lobe calcified granuloma. 4 mm ground-glass nodule in the right lower lobe on image 26/6 does not warrant imaging follow-up per consensus guidelines. Lingular and left lower lobe compressive atelectasis. Mild cardiomegaly with right coronary artery calcification. Small left pleural effusion. Hepatobiliary: Normal liver. Cholecystectomy, without biliary ductal dilatation. Pancreas: Normal, without mass or ductal dilatation. Spleen: Normal in size, without focal abnormality. Adrenals/Urinary Tract: Normal adrenal glands. Probable punctate upper pole right renal collecting system calculus, only apparent on coronal image 75. Left-sided extrarenal pelvis. No hydronephrosis. No hydroureter or ureteric calculi. No bladder calculi. Stomach/Bowel: Normal stomach, without wall thickening. Colonic stool burden suggests constipation. Normal terminal ileum and appendix. Periampullary duodenal diverticulum is diminutive. Normal small bowel. Vascular/Lymphatic: Advanced aortic and branch vessel atherosclerosis. No abdominopelvic adenopathy. Reproductive: Small uterine calcifications may represent underlying dystrophic fibroids. No adnexal mass. Other: No significant free fluid. Mild to moderate pelvic floor laxity. Fat containing ventral pelvic wall hernia is small. Small bilateral fat containing inguinal hernias. Musculoskeletal: Lumbosacral spondylosis. Mild convex left lumbar spine curvature. IMPRESSION: 1. Possible punctate upper pole right renal collecting system calculus. No obstructive uropathy. 2. Possible constipation. 3. Small left pleural effusion. 4. Coronary artery atherosclerosis. Aortic Atherosclerosis (ICD10-I70.0). Electronically Signed   By: Abigail Miyamoto M.D.   On: 12/04/2021 13:47      Subjective: Patient seen and examined at bedside.  Poor historian.  Daughter at bedside states that she is different today compared  to yesterday and is more slow and slightly  confused.  No overnight fever, agitation reported.  Discharge Exam: Vitals:   12/28/21 2059 12/29/21 0515  BP: 134/85 122/75  Pulse: 93 82  Resp: 18 19  Temp: 98.5 F (36.9 C) 97.7 F (36.5 C)  SpO2: 92% 95%    General exam: No distress.  On room air currently.  Elderly female lying in bed.  Looks chronically ill and deconditioned. Respiratory system: Decreased breath sounds at bases bilaterally, no wheezing cardiovascular system: Currently rate controlled; S1-S2 heard Gastrointestinal system: Abdomen is distended slightly; soft and nontender.  Bowel sounds are heard Extremities: Mild lower extremity edema present; no clubbing       The results of significant diagnostics from this hospitalization (including imaging, microbiology, ancillary and laboratory) are listed below for reference.     Microbiology: Recent Results (from the past 240 hour(s))  Urine Culture     Status: None   Collection Time: 12/26/21  3:57 PM   Specimen: Urine, Clean Catch  Result Value Ref Range Status   Specimen Description   Final    URINE, CLEAN CATCH Performed at [facility], [place] [address], [place], [place] [address]    Special Requests   Final    NONE Performed at [facility], [place] [address], [place], [place] [address]    Culture   Final    NO GROWTH Performed at [facility], [place] [address], [place], [place] [address]    Report Status 12/27/2021 FINAL  Final     Labs: BNP (last 3 results) Recent Labs    12/26/21 1400  BNP 123456*   Basic Metabolic Panel: Recent Labs  Lab 12/26/21 1400 12/27/21 0530 12/28/21 0546  NA 138 140 139  K 4.4 4.6 4.2  CL 106 104 104  CO2 22 29 26   GLUCOSE 126* 143* 172*  BUN 13 14 22   CREATININE 0.91 1.03* 0.88  CALCIUM 8.6* 8.5* 8.6*   Liver Function Tests: Recent Labs  Lab 12/26/21 1400 12/27/21 0530 12/28/21 0546  AST 38 24 26  ALT 26 21 27   ALKPHOS 34* 39 36*  BILITOT 1.9* 1.3* 0.9   PROT 6.5 6.1* 6.1*  ALBUMIN 3.2* 2.9* 2.9*   Recent Labs  Lab 12/26/21 1400  LIPASE 26   No results for input(s): "AMMONIA" in the last 168 hours. CBC: Recent Labs  Lab 12/26/21 1400 12/27/21 0530 12/28/21 0546  WBC 10.3 8.4 7.4  NEUTROABS 8.6*  --   --   HGB 12.6 12.0 12.3  HCT 38.1 35.9* 36.5  MCV 106.4* 106.5* 106.1*  PLT 156 271 288   Cardiac Enzymes: No results for input(s): "CKTOTAL", "CKMB", "CKMBINDEX", "TROPONINI" in the last 168 hours. BNP: Invalid input(s): "POCBNP" CBG: No results for input(s): "GLUCAP" in the last 168 hours. D-Dimer No results for input(s): "DDIMER" in the last 72 hours. Hgb A1c No results for input(s): "HGBA1C" in the last 72 hours. Lipid Profile No results for input(s): "CHOL", "HDL", "LDLCALC", "TRIG", "CHOLHDL", "LDLDIRECT" in the last 72 hours. Thyroid function studies Recent Labs    12/27/21 0530  TSH 1.661   Anemia work up No results for input(s): "VITAMINB12", "FOLATE", "FERRITIN", "TIBC", "IRON", "RETICCTPCT" in the last 72 hours. Urinalysis    Component Value Date/Time   COLORURINE YELLOW 12/26/2021 1557   APPEARANCEUR HAZY (A) 12/26/2021 1557   LABSPEC 1.008 12/26/2021 1557   PHURINE 7.0 12/26/2021 1557   GLUCOSEU NEGATIVE 12/26/2021 1557   HGBUR  SMALL (A) 12/26/2021 1557   BILIRUBINUR NEGATIVE 12/26/2021 1557   KETONESUR NEGATIVE 12/26/2021 1557   PROTEINUR NEGATIVE 12/26/2021 1557   NITRITE POSITIVE (A) 12/26/2021 1557   LEUKOCYTESUR NEGATIVE 12/26/2021 1557   Sepsis Labs Recent Labs  Lab 12/26/21 1400 12/27/21 0530 12/28/21 0546  WBC 10.3 8.4 7.4   Microbiology Recent Results (from the past 240 hour(s))  Urine Culture     Status: None   Collection Time: 12/26/21  3:57 PM   Specimen: Urine, Clean Catch  Result Value Ref Range Status   Specimen Description   Final    URINE, CLEAN CATCH Performed at [facility], [place] [address] [place], [place] [address]    Special Requests    Final    NONE Performed at [facility], [place] [address] [place], [place] [address]    Culture   Final    NO GROWTH Performed at [facility], [place] [address], [place], [place] [address]    Report Status 12/27/2021 FINAL  Final     Time coordinating discharge: 35 minutes  SIGNED:   Aline August, MD  [facility] 12/29/2021, 1:08 PM

## 2021-12-29 NOTE — Care Management Important Message (Signed)
Important Message  Patient Details  Name: Kayla Zuniga MRN: [id number] Date of Birth: [birth year]   Medicare Important Message Given:  Yes     Mardene Sayer 12/29/2021, 3:00 PM

## 2021-12-29 NOTE — Progress Notes (Signed)
PROGRESS NOTE    Kayla Zuniga  VZD:[id number] DOB: 21-Dec-1933 DOA: 12/26/2021 PCP: Laurann Montana, MD   Brief Narrative:  88 y.o. female with medical history significant of atrial fibrillation on anticoagulation, hypertension, macular degeneration, hypothyroidism presented with worsening lower extremity weakness for 1 to 2 months along with lower back pain, bilateral lower extremity numbness and intermittent bowel and bladder incontinence.  She can no longer ambulate.  She was found to have severe spinal stenosis as an outpatient and was supposed to have neurosurgery evaluation as an outpatient as well.  During the hospitalization, PT recommended SNF placement.  Assessment & Plan:   Back pain with leg weakness Severe spinal stenosis --Patient had MRI done earlier this month on 12/04/2021 that did show severe spinal stenosis in the lumbar and thoracic spine -Case was reviewed with neurosurgery on-call by prior hospitalist who recommended no urgent neurosurgical evaluation needs and recommended that patient be evaluated by Dr. Wynetta Emery in the outpatient setting; she already has a neurosurgery referral in place -Currently on Medrol Dosepak -Continue pain management and physical therapy.  Currently medically stable for SNF placement.  Urinary tract infection has been ruled out -Antibiotics have been discontinued  Possible delirium Possible undiagnosed dementia -As per the daughter, patient is slightly more confused this morning and slow to respond: Might be possibly from delirium.  Patient possibly also has undiagnosed dementia. -Monitor mental status.  Will need outpatient neurology evaluation.  Persistent atrial fibrillation  -Currently rate from.  Continue Eliquis, Cardizem and nebivolol.  Outpatient follow-up with cardiology  Hypertension -Blood pressure on the lower side.  Continue Cardizem and nebivolol.  Valsartan held  Hyperlipidemia -Continue statin  Hypothyroidism--continue  Synthroid  Obesity -Outpatient follow-up  Macular degeneration, bilateral -Severely compromised eyesight.  Outpatient follow-up with ophthalmology  Fall Generalized weakness -Will need PT/OT at SNF.  Social worker following.  DVT prophylaxis: Eliquis Code Status: DNR Family Communication: None at bedside Disposition Plan: Status is: Inpatient Remains inpatient appropriate because: Of need for SNF placement  Consultants: None  Procedures: None  Antimicrobials: None   Subjective: Patient seen and examined at bedside.  Poor historian.  Daughter at bedside states that she is different today compared to yesterday and is more slow and slightly confused.  No overnight fever, agitation reported.  Objective: Vitals:   12/28/21 0900 12/28/21 1209 12/28/21 2059 12/29/21 0515  BP: (!) 116/58 115/62 134/85 122/75  Pulse: 70 83 93 82  Resp:  20 18 19   Temp:  (!) 97.4 F (36.3 C) 98.5 F (36.9 C) 97.7 F (36.5 C)  TempSrc:  Oral Oral   SpO2:  96% 92% 95%  Weight:      Height:        Intake/Output Summary (Last 24 hours) at 12/29/2021 1105 Last data filed at 12/29/2021 0900 Gross per 24 hour  Intake 720 ml  Output 1900 ml  Net -1180 ml    Filed Weights   12/26/21 1330  Weight: 72.1 kg    Examination:  General exam: No distress.  On room air currently.  Elderly female lying in bed.  Looks chronically ill and deconditioned. Respiratory system: Decreased breath sounds at bases bilaterally, no wheezing cardiovascular system: Currently rate controlled; S1-S2 heard Gastrointestinal system: Abdomen is distended slightly; soft and nontender.  Bowel sounds are heard Extremities: Mild lower extremity edema present; no clubbing   Data Reviewed: I have personally reviewed following labs and imaging studies  CBC: Recent Labs  Lab 12/26/21 1400 12/27/21 0530 12/28/21 0546  WBC 10.3 8.4 7.4  NEUTROABS 8.6*  --   --   HGB 12.6 12.0 12.3  HCT 38.1 35.9* 36.5  MCV 106.4*  106.5* 106.1*  PLT 156 271 288    Basic Metabolic Panel: Recent Labs  Lab 12/26/21 1400 12/27/21 0530 12/28/21 0546  NA 138 140 139  K 4.4 4.6 4.2  CL 106 104 104  CO2 22 29 26   GLUCOSE 126* 143* 172*  BUN 13 14 22   CREATININE 0.91 1.03* 0.88  CALCIUM 8.6* 8.5* 8.6*    GFR: Estimated Creatinine Clearance: 41.4 mL/min (by C-G formula based on SCr of 0.88 mg/dL). Liver Function Tests: Recent Labs  Lab 12/26/21 1400 12/27/21 0530 12/28/21 0546  AST 38 24 26  ALT 26 21 27   ALKPHOS 34* 39 36*  BILITOT 1.9* 1.3* 0.9  PROT 6.5 6.1* 6.1*  ALBUMIN 3.2* 2.9* 2.9*    Recent Labs  Lab 12/26/21 1400  LIPASE 26    No results for input(s): "AMMONIA" in the last 168 hours. Coagulation Profile: Recent Labs  Lab 12/26/21 1400  INR 1.5*    Cardiac Enzymes: No results for input(s): "CKTOTAL", "CKMB", "CKMBINDEX", "TROPONINI" in the last 168 hours. BNP (last 3 results) No results for input(s): "PROBNP" in the last 8760 hours. HbA1C: No results for input(s): "HGBA1C" in the last 72 hours. CBG: No results for input(s): "GLUCAP" in the last 168 hours. Lipid Profile: No results for input(s): "CHOL", "HDL", "LDLCALC", "TRIG", "CHOLHDL", "LDLDIRECT" in the last 72 hours. Thyroid Function Tests: Recent Labs    12/27/21 0530  TSH 1.661    Anemia Panel: No results for input(s): "VITAMINB12", "FOLATE", "FERRITIN", "TIBC", "IRON", "RETICCTPCT" in the last 72 hours. Sepsis Labs: Recent Labs  Lab 12/26/21 1355 12/26/21 1555  LATICACIDVEN 1.7 1.5     Recent Results (from the past 240 hour(s))  Urine Culture     Status: None   Collection Time: 12/26/21  3:57 PM   Specimen: Urine, Clean Catch  Result Value Ref Range Status   Specimen Description   Final    URINE, CLEAN CATCH Performed at [facility], [address], [place], [place]    Special Requests   Final    NONE Performed at [facility], [address],  [place], [place]    Culture   Final    NO GROWTH Performed at [facility], [address], [place], [facility] [address]    Report Status 12/27/2021 FINAL  Final         Radiology Studies: No results found.      Scheduled Meds:  apixaban  5 mg Oral BID   atorvastatin  10 mg Oral Daily   Chlorhexidine Gluconate Cloth  6 each Topical Daily   diltiazem  30 mg Oral BID   escitalopram  10 mg Oral Daily   [START ON 01/01/2022] levothyroxine  25 mcg Oral Q Sun   levothyroxine  50 mcg Oral Once per day on Mon Tue Wed Thu Fri Sat   methylPREDNISolone  4 mg Oral 4X daily taper   nebivolol  10 mg Oral QHS   nystatin   Topical BID   senna-docusate  1 tablet Oral BID   Continuous Infusions:        Tue, MD [facility] 12/29/2021, 11:05 AM

## 2021-12-29 NOTE — TOC Progression Note (Addendum)
Transition of Care [facility]) - Progression Note    Patient Details  Name: Kayla Zuniga MRN: [id number] Date of Birth: 1934/05/06  Transition of Care [facility]) CM/SW Contact  Otelia Santee, LCSW Phone Number: 12/29/2021, 11:48 AM  Clinical Narrative:    Spoke with pt's daughter who shared their family friend has contacted [facility] and they would like to accept bed offer for their SNF. CSW contacted [facility] and confirmed bed offer. Insurance Berkley Harvey has been started.   1224: Pt's insurance authorization was approved ID: [id number]  1430: CSW received call from pt's daughter who stated that this pt's son has decided that he prefers pt to discharge to [facility] over [facility]. CSW contacted [facility] to confirm bed availability.   1500: [facility] is still able to offer SNF bed for this pt. Insurance Berkley Harvey will need to have location changed from [facility] to [facility] [employer]. Insurance authorization portal is currently unavailable. CSW called [facility] and was informed site/access is down and they are unable to make changes to authorizations at this time. CSW will continue to monitor and will request change in insurance authorization as soon as able.    Expected Discharge Plan: Skilled Nursing Facility Barriers to Discharge: No Barriers Identified  Expected Discharge Plan and Services Expected Discharge Plan: Skilled Nursing Facility In-house Referral: Clinical Social Work Discharge Planning Services: CM Consult Post Acute Care Choice: Skilled Nursing Facility Living arrangements for the past 2 months: Single Family Home                 DME Arranged: N/A DME Agency: NA                   Social Determinants of Health (SDOH) Interventions    Readmission Risk Interventions     No data to display

## 2021-12-30 NOTE — Progress Notes (Signed)
Patient is waiting to be discharged to SNF.  Patient seen and examined at bedside.  She is currently medically stable for discharge.  Please refer to the full discharge summary done by me on 12/29/2021 for full details.

## 2021-12-30 NOTE — Progress Notes (Signed)
Physical Therapy Treatment Patient Details Name: Kayla Zuniga MRN: [id number] DOB: 1933/07/26 Today's Date: 12/30/2021   History of Present Illness 88 y.o. female with medical history significant of atrial fibrillation on anticoagulation, hypertension, macular degeneration, hypothyroidism who lives at home with her daughter.  At baseline she was ambulating with a walker but has declined and now cannot walk.  Her daughter reports that for the past 1 to 2 months she has had worsening lower extremity weakness, lower back pain radiating down into her legs, some numbness in her legs bilaterally and bowel and urinary incontinence. Dx of a fib, spinal stenosis, possible UTI.    PT Comments    Pt is slowly progressing toward acute PT goals able to ambulate 62ft today with use of RW and MOD A+2 and perform step transfer to recliner chair. Limited with further ambulation distance due to onset of L LE pain. Requires encouragement for initiate of movement/independence. Pt will benefit from continued skilled PT to increase their independence and maximize safety with mobility.      Recommendations for follow up therapy are one component of a multi-disciplinary discharge planning process, led by the attending physician.  Recommendations may be updated based on patient status, additional functional criteria and insurance authorization.  Follow Up Recommendations  Skilled nursing-short term rehab (<3 hours/day) Can patient physically be transported by private vehicle: Yes   Assistance Recommended at Discharge Frequent or constant Supervision/Assistance  Patient can return home with the following A lot of help with walking and/or transfers;A lot of help with bathing/dressing/bathroom;Help with stairs or ramp for entrance;Assist for transportation;Direct supervision/assist for financial management;Direct supervision/assist for medications management   Equipment Recommendations       Recommendations for Other  Services       Precautions / Restrictions Precautions Precautions: Fall Precaution Comments: monitor HR Restrictions Weight Bearing Restrictions: No     Mobility  Bed Mobility Overal bed mobility: Needs Assistance Bed Mobility: Supine to Sit     Supine to sit: Mod assist, HOB elevated     General bed mobility comments: assist to raise trunk with increased time and multimodal cues for movement of BLE over the edge of the bed.    Transfers Overall transfer level: Needs assistance Equipment used: Rolling walker (2 wheels) Transfers: Sit to/from Stand, Bed to chair/wheelchair/BSC Sit to Stand: Min assist, +2 physical assistance   Step pivot transfers: Min assist, +2 physical assistance, +2 safety/equipment       General transfer comment: assist to power up from bed, able to take lateral steps to L and then steps to recliner chair.    Ambulation/Gait Ambulation/Gait assistance: +2 physical assistance, Mod assist Gait Distance (Feet): 4 Feet Assistive device: Rolling walker (2 wheels) Gait Pattern/deviations: Decreased stride length, Step-through pattern Gait velocity: decr     General Gait Details: limited by onset of L LE pain, decreased foot clearance and up to MOD A +2 with difficulty performing weight shifts to progress R LE. x1 R LE knee buckle noted   Stairs             Wheelchair Mobility    Modified Rankin (Stroke Patients Only)       Balance Overall balance assessment: Needs assistance, History of Falls Sitting-balance support: Feet supported Sitting balance-Leahy Scale: Fair     Standing balance support: Bilateral upper extremity supported, Reliant on assistive device for balance Standing balance-Leahy Scale: Poor  Cognition Arousal/Alertness: Awake/alert Behavior During Therapy: Flat affect Overall Cognitive Status: Within Functional Limits for tasks assessed                                           Exercises      General Comments        Pertinent Vitals/Pain Pain Assessment Pain Assessment: Faces Faces Pain Scale: Hurts little more Pain Location: L LE with mobility Pain Descriptors / Indicators:  (pulling) Pain Intervention(s): Limited activity within patient's tolerance, Monitored during session, Repositioned    Home Living                          Prior Function            PT Goals (current goals can now be found in the care plan section) Acute Rehab PT Goals Patient Stated Goal: to go to rehab PT Goal Formulation: With patient/family Time For Goal Achievement: 01/10/22 Potential to Achieve Goals: Good Progress towards PT goals: Progressing toward goals    Frequency    Min 2X/week      PT Plan Current plan remains appropriate    Co-evaluation              AM-PAC PT "6 Clicks" Mobility   Outcome Measure  Help needed turning from your back to your side while in a flat bed without using bedrails?: A Little Help needed moving from lying on your back to sitting on the side of a flat bed without using bedrails?: A Lot Help needed moving to and from a bed to a chair (including a wheelchair)?: Total Help needed standing up from a chair using your arms (e.g., wheelchair or bedside chair)?: Total Help needed to walk in hospital room?: Total Help needed climbing 3-5 steps with a railing? : Total 6 Click Score: 9    End of Session Equipment Utilized During Treatment: Gait belt Activity Tolerance: Patient limited by pain Patient left: in chair;with call bell/phone within reach;with chair alarm set Nurse Communication: Mobility status PT Visit Diagnosis: Difficulty in walking, not elsewhere classified (R26.2);History of falling (Z91.81);Muscle weakness (generalized) (M62.81);Unsteadiness on feet (R26.81)     Time: 5409-8119 PT Time Calculation (min) (ACUTE ONLY): 14 min  Charges:  $Therapeutic Activity: 8-22 mins                      Lyman Speller PT, DPT  Acute Rehabilitation Services  Office [phone]   12/30/2021, 12:20 PM

## 2021-12-30 NOTE — TOC Transition Note (Addendum)
Transition of Care [facility]) - CM/SW Discharge Note   Patient Details  Name: Kayla Zuniga MRN: [id number] Date of Birth: 02-14-1934  Transition of Care [facility]) CM/SW Contact:  Otelia Santee, LCSW Phone Number: 12/30/2021, 10:40 AM   Clinical Narrative:    Pt and family have accepted bed offer for [facility]. Pt's insurance authorization has been approved [id number] and is good through 01/04/2022. Pt is to discharge to [facility] room 302B today. Nurses to call report to [phone]. PTAR to be arranged for transportation.    Final next level of care: Skilled Nursing Facility Barriers to Discharge: No Barriers Identified   Patient Goals and CMS Choice Patient states their goals for this hospitalization and ongoing recovery are:: To return home following SNF   Choice offered to / list presented to : Patient, Adult Children  Discharge Placement PASRR number recieved: 12/27/21            Patient chooses bed at: [facility] Patient to be transferred to facility by: PTAR Name of family member notified: Daughter, Ethelle Lyon Patient and family notified of of transfer: 12/30/21  Discharge Plan and Services In-house Referral: Clinical Social Work Discharge Planning Services: CM Consult Post Acute Care Choice: Skilled Nursing Facility          DME Arranged: N/A DME Agency: NA                  Social Determinants of Health (SDOH) Interventions     Readmission Risk Interventions    12/30/2021   10:38 AM  Readmission Risk Prevention Plan  Transportation Screening Complete  PCP or Specialist Appt within 5-7 Days Complete  Home Care Screening Complete  Medication Review (RN CM) Complete

## 2022-02-07 ENCOUNTER — Encounter: Payer: Self-pay | Admitting: Neurology

## 2022-02-07 ENCOUNTER — Ambulatory Visit (INDEPENDENT_AMBULATORY_CARE_PROVIDER_SITE_OTHER): Payer: Medicare Other | Admitting: Neurology

## 2022-02-07 VITALS — BP 100/60 | HR 58 | Ht 60.0 in | Wt 159.0 lb

## 2022-02-07 DIAGNOSIS — F03918 Unspecified dementia, unspecified severity, with other behavioral disturbance: Secondary | ICD-10-CM

## 2022-02-07 MED ORDER — MEMANTINE HCL 5 MG PO TABS: 5.0000 mg | ORAL_TABLET | Freq: Every day | ORAL | 3 refills | Status: DC

## 2022-02-07 NOTE — Progress Notes (Signed)
Subjective:    Patient ID: Kayla Zuniga is a 88 y.o. female.  HPI    Interim history:   Kayla Zuniga is an 88 year old right-handed woman with an underlying medical history of hypertension, hyperlipidemia, atrial fibrillation, diabetes, retinopathy, macular degeneration, osteoporosis, vitamin B12 deficiency, hypothyroidism, and mild obesity, who presents for follow-up consultation of her memory loss.  The patient is accompanied by her daughter today.  I first met her at the request of her primary care physician on 10/12/2021, at which time she was doing better with regards to her hallucinations.  Her MMSE was 21 out of 30 at the time.  I suggested we proceed with a sleep study and a brain MRI.    She had a brain MRI without contrast on 10/22/2021 and I reviewed the results:    IMPRESSION: This MRI of the brain without contrast shows the following: 1.  Moderate generalized cortical atrophy, more than expected for age 22.  Few scattered T2/FLAIR hyperintense foci consistent with fairly mild chronic microvascular ischemic change, typical for age.  None of these appear to be acute. 3.  Chronic maxillary, ethmoid and frontal sinusitis and mild acute right frontal and sphenoid sinusitis. Her son was notified the test results.  She had a baseline sleep study on 10/19/2021 which showed an AHI of 3.6/h, REM AHI elevated at 24.8/h, supine AHI 3.6/h, average oxygen saturation 95%, nadir was 78%.  Her son was advised of the test results and advised that a CPAP machine was not indicated but mild weight loss and avoiding the supine sleep position may alleviate her REM related sleep apnea.   Today, 02/07/2022: she reports very little on her own, her history is provided by her daughter.  Per daughter, they are applying for long-term care at [facility] with the patient is currently in rehab.  Of note, she was hospitalized at the end of June from 12/26/2021 through 12/29/2021.  She presented with back pain.  She  had sustained a fall in June 2023.  She was found to be in A-fib with rapid ventricular response.  She was advised to follow-up with neurosurgery.  She was on Medrol Dosepak.  She was transferred to skilled nursing facility.  She was maintained on Eliquis and nebivolol.  Lasix was held due to low blood pressure values.  She was advised to follow-up with ophthalmology due to macular degeneration. She is currently at [facility] place. Per daughter, she has regular eye appointments.  She had received injections into her eyes for macular.  Appetite is good, she has not fallen since she has been in rehab.  There is some evidence of hallucinations.  The patient's allergies, current medications, family history, past medical history, past social history, past surgical history and problem list were reviewed and updated as appropriate.   Previously:   10/12/21: (She) reports feeling better.  Per son, she has a longer standing history of memory loss for at least months, she has had recent onset of hallucinations last month.  She denies any hallucinations, son endorses that she has been better since her hospitalization. I reviewed your office note from 09/12/2021.  Her son reported a 2-day history of altered mental status including auditory hallucinations.  She had blood work and urine testing through your office at the time and I reviewed the results from 09/12/2021: Urinalysis showed large leukocytes and positive nitrites.  She was found to have a UTI.  CMP showed elevated blood sugar of 125, BUN elevated at 27, creatinine 0.95,  sodium and potassium normal, total bilirubin elevated at 1.4, CBC with differential showed a mildly low RBC of 3.91, MCV elevated at 102, MCH elevated at 34.1, B12 on the low side of the spectrum of 259.  TSH elevated at 5.78.  She was hospitalized in geriatric psychiatry. She had A-fib with RVR.  Hospital records are not available for my review today.   Her Epworth sleepiness score is 12 out of 24,  fatigue severity score is 34 out of 63.   She reports no family history of dementia but her younger sister has some memory issues, sister is 30 years old.  Father lived to be 68 and Mom lived to the 6.  She is divorced and has 1 son and 2 daughters, 1 daughter lives in [place] but is currently visiting, son lives in [place] and the other daughter lives in [place], for now, both daughters and taking care to stay with her overnight, they are looking into a long-term living facility such as memory care or assisted living for her son.  They are looking at [facility] in [place] or [place].  She is a non-smoker and does not drink alcohol, she does not drink caffeine on a daily basis.  She snores some per son.  She has never had a sleep study.  She has not driven a car since her hospitalization and reports that she will no longer drive, her son reports that she willingly gave it up.  She feels better after hospitalization, she is pending an outpatient psychiatry follow-up as I understand.  She was not started on any new medication, she was briefly treated for a urinary tract infection.  She also had blood work and urine testing which she was at [facility].  Her son provides her discharge instructions which we copied.  She was admitted on 09/15/2021 and discharged on 10/05/2021.   She is taking oral vitamin B12.   Her Past Medical History Is Significant For: Past Medical History:  Diagnosis Date   Afib ([place]) 2018   Doristine Devoid, NP--> Dr Jacinto Reap   Chronic kidney disease, stage 3 ([place])    GFR 47 borderline elevated phosphorus, Dr Florene Glen, no longer follows   Chronic urticaria    Diabetes mellitus without complication ([place])    Essential hypertension 09/19/2017   Hypercholesterolemia    Hypertension    Hypothyroidism 10/2019   Macular degeneration    Osteoporosis    intolerant of biphosphates, on Prolia since 2019   Retinal hemorrhage    left eye, Dr Baird Cancer, 2017, improved with  injections   Vitamin B12 deficiency     Her Past Surgical History Is Significant For: Past Surgical History:  Procedure Laterality Date   BUNIONECTOMY     CARDIOVERSION N/A 04/13/2017   Procedure: CARDIOVERSION;  Surgeon: Pixie Casino, MD;  Location: [facility] ENDOSCOPY;  Service: Cardiovascular;  Laterality: N/A;   CATARACT EXTRACTION, BILATERAL  2016   Dr Herbert Deaner, 11/16 and 12/16   CHOLECYSTECTOMY  1973   DILATION AND CURETTAGE, DIAGNOSTIC / [place]     age 86    Her Family History Is Significant For: Family History  Problem Relation Age of Onset   Liver disease Sister    Heart disease Maternal Grandmother    Alzheimer's disease Neg Hx    Dementia Neg Hx     Her Social History Is Significant For: Social History   Socioeconomic History   Marital status: Divorced    Spouse name:  Not on file   Number of children: 3   Years of education: Not on file   Highest education level: Not on file  Occupational History   Not on file  Tobacco Use   Smoking status: Never   Smokeless tobacco: Never  Vaping Use   Vaping Use: Never used  Substance and Sexual Activity   Alcohol use: Yes    Comment: rarely   Drug use: Never   Sexual activity: Not on file  Other Topics Concern   Not on file  Social History Narrative   Not on file   Social Determinants of Health   Financial Resource Strain: Not on file  Food Insecurity: Not on file  Transportation Needs: Not on file  Physical Activity: Not on file  Stress: Not on file  Social Connections: Not on file    Her Allergies Are:  Allergies  Allergen Reactions   Penicillins Hives, Itching and Rash        Sulfa Antibiotics Itching and Rash   Atenolol Other (See Comments)    Dizziness- made the patient "tired"   Ibandronic Acid Other (See Comments)    Hallucinations   Alendronate Other (See Comments)    Muscle/bone pain   Amlodipine Besylate Swelling and Other (See Comments)    Edema at higher doses    Prolia [Denosumab] Other (See Comments)    "Hallucinations," but the patient received this in 2023  :   Her Current Medications Are:  Outpatient Encounter Medications as of 02/07/2022  Medication Sig   acetaminophen (TYLENOL) 500 MG tablet Take 500 mg by mouth every 6 (six) hours as needed for mild pain or headache.   apixaban (ELIQUIS) 5 MG TABS tablet TAKE 1 TABLET BY MOUTH TWICE  DAILY (Patient taking differently: Take 5 mg by mouth in the morning and at bedtime.)   atorvastatin (LIPITOR) 10 MG tablet Take 10 mg by mouth daily.   bismuth subsalicylate (PEPTO BISMOL) 262 MG/15ML suspension Take 15 mLs by mouth 2 (two) times daily as needed for diarrhea or loose stools.   Cholecalciferol (VITAMIN D3) 50 MCG (2000 UT) TABS Take 2,000 Units by mouth daily.   CRANBERRY EXTRACT PO Take 1 tablet by mouth daily.   denosumab (PROLIA) 60 MG/ML SOSY injection Inject 60 mg into the skin every 6 (six) months.   escitalopram (LEXAPRO) 10 MG tablet Take 10 mg by mouth daily.   gabapentin (NEURONTIN) 100 MG capsule Take 100 mg by mouth daily.   levothyroxine (SYNTHROID) 25 MCG tablet Take 25-50 mcg by mouth See admin instructions. Take 25 mcg by mouth in the morning before breakfast on Sunday and 50 mcg on Mon/Tues/Wed/Thurs/Fri/Sat   lidocaine (LIDODERM) 5 % Place 1 patch onto the skin daily. Remove & Discard patch within 12 hours or as directed by MD   loperamide (IMODIUM) 2 MG capsule Take by mouth.   methylPREDNISolone (MEDROL DOSEPAK) 4 MG TBPK tablet Patient should finish remainder of Medrol Dosepak that was started on 6/28 by taking 1 tablet this evening on 6/29, then take 1 tablet two times daily on 6/30, then 1 tablet in the morning on 7/1 to complete the steroid taper.   Multiple Vitamins-Minerals (OCUVITE EYE HEALTH FORMULA) CAPS Take 1 capsule by mouth daily with breakfast.   nebivolol (BYSTOLIC) 10 MG tablet Take 10 mg by mouth at bedtime.   Omega-3 Fatty Acids (FISH OIL) 1000 MG CAPS Take  1,000 mg by mouth daily.   polyethylene glycol (MIRALAX / GLYCOLAX) 17 g packet  Take 17 g by mouth daily as needed for moderate constipation.   senna-docusate (SENOKOT-S) 8.6-50 MG tablet Take 1 tablet by mouth 2 (two) times daily.   No facility-administered encounter medications on file as of 02/07/2022.  :  Review of Systems:  Out of a complete 14 point review of systems, all are reviewed and negative with the exception of these symptoms as listed below:  Review of Systems  Neurological:        Pt here for memory loss f/u   Daughter states patient is currently in [facility] . Pt states her short memory is worse  Daughter states some hallucinations      Objective:  Neurological Exam  Physical Exam Physical Examination:   Vitals:   02/07/22 1304  BP: 100/60  Pulse: (!) 58    General Examination: The patient is a very pleasant 88 y.o. female in no acute distress. She appears well-developed and well groomed.   HEENT: Normocephalic, atraumatic, pupils are equal, round and reactive to light, extraocular tracking is impaired.  Hearing grossly intact.  Face is symmetric with normal facial animation, speech is clear but scant.  Neck is supple, no carotid bruits.  Airway examination reveals moderate mouth dryness, otherwise stable findings.     Chest: Clear to auscultation without wheezing, rhonchi or crackles noted.   Heart: S1+S2+0, irregularly irregular.    Abdomen: Soft, non-tender and non-distended with normal bowel sounds appreciated on auscultation.   Extremities: There is 1 to 2+ pitting edema in the distal lower extremities bilaterally.  Skin: Warm and dry without trophic changes noted.    Musculoskeletal: exam reveals no obvious joint deformities.    Neurologically:  Mental status: The patient is awake, alert and attention, she is oriented to self and circumstance. She is not able to provide a detailed history, details are provided by her daughter. Her immediate and  remote memory, attention, language skills and fund of knowledge are impaired.  There is no evidence of aphasia, agnosia, apraxia or anomia. Speech is clear but scant.  Mood is normal and affect is normal.        10/12/2021    8:08 AM  MMSE - Mini Mental State Exam  Orientation to time 4  Orientation to Place 3  Registration 3  Attention/ Calculation 2  Recall 3  Language- name 2 objects 2  Language- repeat 0  Language- follow 3 step command 3  Language- read & follow direction 1  Write a sentence 0  Copy design 0  Total score 21      On 10/12/2021: AFT: 11/min.   Cranial nerves II - XII are as described above under HEENT exam.  Motor exam: Normal bulk, global strength of 4 out of 5, no obvious tremor, fine motor skills fairly well-preserved, no lateralization.  Cerebellar testing: No dysmetria or intention tremor. There is no truncal or gait ataxia.  Sensory exam: intact to light touch in the upper and lower extremities.  Gait, station and balance: She stands with is in a wheelchair, I did not have her stand or walk for me today.     Assessment and Plan:  In summary, Kayla Zuniga is a very pleasant 88 year old female with an underlying medical history of hypertension, hyperlipidemia, atrial fibrillation, diabetes, retinopathy, macular degeneration, osteoporosis, vitamin B12 deficiency, hypothyroidism, and mild obesity, who presents for follow-up consultation of her memory loss of several months' duration.  She has had associated hallucinations.  She had inpatient stay for her psychosis  in the recent past.  She was recently hospitalized for back pain, a recent fall and A-fib with RVR.  She is currently at [facility] in rehab and her family is trying to get her in long-term placement in memory care.  Work-up from iron the things included a sleep study and brain MRI recently.  Sleep study on 10/19/2021 did not show any significant obstructive sleep apnea other than REM related sleep  apnea.  MRI brain without contrast from 10/22/2021 showed moderate generalized atrophy.  She has been on B12 supplementation by mouth.  We talked about the importance of supportive care, good nutrition, good hydration, and fall prevention today.  We talked about medication options for memory loss.  I would not favor donepezil for her due to low heart rate and the possibility of exacerbating hallucinations with it.  I suggested low-dose memantine 5 mg strength once daily with the intent of gradually increasing it over time if tolerated.  The patient and her daughter are agreeable to starting this.  I suggested memantine 5 mg strength once daily at bedtime for now, I placed the recommendation for consultation she can place.  If necessary, we can send an order or provide a verbal order to them.  She is advised to follow-up to see one of our nurse practitioners in about 3 months at which point we can try to increase the memantine to 5 mg twice daily.  I answered all their questions today and the patient and her daughter were in agreement.  Of note, I did not send an actual prescription to her pharmacy on file as she is currently at [facility] and prescription and medications are provided from [facility].  I spent 40 minutes in total face-to-face time and in reviewing extensive records during pre-charting, more than 50% of which was spent in counseling and coordination of care, reviewing test results, reviewing medications and treatment regimen and/or in discussing or reviewing the diagnosis of memory loss, the prognosis and treatment options. Pertinent laboratory and imaging test results that were available during this visit with the patient were reviewed by me and considered in my medical decision making (see chart for details).

## 2022-02-07 NOTE — Patient Instructions (Signed)
It was nice to see you again today.  As discussed, I would not recommend starting you on a medication called Aricept, generic name donepezil as it can lower heart rate and exacerbate hallucinations.  We will start you for your memory on Namenda (generic name: Memantine), starting at 5 mg once daily at bedtime with gradual buildup over time. Please note that side effects may include, but are not limited to: nausea, confusion, hallucination, personality changes. If you are having mild side effects, try to stick with the treatment as these initial side effects may go away after the first 10-14 days.

## 2022-05-11 ENCOUNTER — Ambulatory Visit: Payer: Medicare Other | Admitting: Adult Health

## 2022-06-02 DEATH — deceased

## 2024-06-02 DEATH — deceased
